# Patient Record
Sex: Male | Born: 1960 | Race: White | Hispanic: No | Marital: Married | Smoking: Current every day smoker
Health system: Southern US, Community
[De-identification: ages and names within clinical notes are randomized; demographics above are authoritative.]

## PROBLEM LIST (undated history)

## (undated) DIAGNOSIS — J387 Other diseases of larynx: Secondary | ICD-10-CM

## (undated) DIAGNOSIS — I8390 Asymptomatic varicose veins of unspecified lower extremity: Secondary | ICD-10-CM

## (undated) DIAGNOSIS — E079 Disorder of thyroid, unspecified: Secondary | ICD-10-CM

## (undated) DIAGNOSIS — E039 Hypothyroidism, unspecified: Secondary | ICD-10-CM

## (undated) HISTORY — DX: Disorder of thyroid, unspecified: E07.9

## (undated) HISTORY — PX: KNEE SURGERY: SHX244

## (undated) HISTORY — DX: Other diseases of larynx: J38.7

## (undated) HISTORY — PX: COLONOSCOPY: SHX174

## (undated) HISTORY — DX: Asymptomatic varicose veins of unspecified lower extremity: I83.90

## (undated) HISTORY — DX: Hypothyroidism, unspecified: E03.9

## (undated) HISTORY — PX: OTHER SURGICAL HISTORY: SHX169

---

## 2015-12-21 ENCOUNTER — Other Ambulatory Visit (HOSPITAL_COMMUNITY): Payer: Self-pay | Admitting: Family Medicine

## 2015-12-21 DIAGNOSIS — I80201 Phlebitis and thrombophlebitis of unspecified deep vessels of right lower extremity: Secondary | ICD-10-CM

## 2015-12-24 ENCOUNTER — Ambulatory Visit (HOSPITAL_COMMUNITY)
Admission: RE | Admit: 2015-12-24 | Discharge: 2015-12-24 | Disposition: A | Discharge status: Home / Self Care | Payer: 59 | Source: Ambulatory Visit | Attending: Family Medicine | Admitting: Family Medicine

## 2015-12-24 DIAGNOSIS — I80201 Phlebitis and thrombophlebitis of unspecified deep vessels of right lower extremity: Secondary | ICD-10-CM

## 2015-12-24 DIAGNOSIS — I82811 Embolism and thrombosis of superficial veins of right lower extremities: Secondary | ICD-10-CM | POA: Diagnosis not present

## 2016-01-14 ENCOUNTER — Encounter: Payer: Self-pay | Admitting: *Deleted

## 2016-01-17 ENCOUNTER — Encounter: Payer: Self-pay | Admitting: Cardiovascular Disease

## 2016-01-17 ENCOUNTER — Ambulatory Visit (INDEPENDENT_AMBULATORY_CARE_PROVIDER_SITE_OTHER): Payer: 59 | Admitting: Cardiovascular Disease

## 2016-01-17 VITALS — BP 140/100 | HR 63 | Ht 70.0 in | Wt 196.0 lb

## 2016-01-17 DIAGNOSIS — I868 Varicose veins of other specified sites: Secondary | ICD-10-CM

## 2016-01-17 DIAGNOSIS — I8390 Asymptomatic varicose veins of unspecified lower extremity: Secondary | ICD-10-CM

## 2016-01-17 DIAGNOSIS — Z136 Encounter for screening for cardiovascular disorders: Secondary | ICD-10-CM

## 2016-01-17 DIAGNOSIS — R030 Elevated blood-pressure reading, without diagnosis of hypertension: Secondary | ICD-10-CM | POA: Diagnosis not present

## 2016-01-17 DIAGNOSIS — IMO0001 Reserved for inherently not codable concepts without codable children: Secondary | ICD-10-CM

## 2016-01-17 NOTE — Progress Notes (Signed)
Patient ID: Aaron Nolan, male   DOB: 25-Jan-1961, 55 y.o.   MRN: [id number]       CARDIOLOGY CONSULT NOTE  Patient ID: Aaron Nolan MRN: [id number] DOB/AGE: 06/28/1961 55 y.o.  Admit date: (Not on file) Primary Physician Jana Half  Reason for Consultation: varicose veins  HPI: The patient is a 55 year old male with a history of tobacco abuse who is referred for the evaluation of venous varicosities. He has a long-standing history of bilateral leg pain. In January he underwent lower extremity ultrasonography which demonstrated superficial venous thrombosis of the right greater saphenous vein.  ECG performed in the office today demonstrates normal sinus rhythm with no ischemic ST segment or T-wave abnormalities, nor any arrhythmias.  He tells me he has had problems with venous varicosities since he was a teenager but they have progressively gotten worse over the years. 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 He denies a history of pulmonary embolism, myocardial infarction, and stroke. He uses compression stockings on occasion which temporarily alleviates the swelling. He has taken aspirin and ibuprofen for pain relief. He has severe pain of both legs.  Says BP is always normal but he is in severe pain today.  Quit smoking 3 weeks ago.  Able to play basketball for 3 hours at a time with teenage sons without limitations other than leg pain (no chest pain, shortness of breath, premature fatigue).   No Known Allergies  Current Outpatient Prescriptions  Medication Sig Dispense Refill  . nicotine (NICODERM CQ - DOSED IN MG/24 HOURS) 21 mg/24hr patch Place 21 mg onto the skin daily.     No current facility-administered medications for this visit.    No past medical history on file.  Past Surgical History  Procedure Laterality Date  . Knee surgery    . Broken ribs    . Collar bone broken       Social History   Social History  . Marital Status: Married    Spouse Name: N/A  . Number of Children: N/A  . Years of Education: N/A   Occupational History  . Not on file.   Social History Main Topics  . Smoking status: Former Smoker    Types: Cigarettes    Quit date: 12/29/2015  . Smokeless tobacco: Never Used  . Alcohol Use: 0.0 oz/week    0 Standard drinks or equivalent per week  . Drug Use: Not on file  . Sexual Activity: Not on file   Other Topics Concern  . Not on file   Social History Narrative     No family history of premature CAD in 1st degree relatives.  Prior to Admission medications   Medication Sig Start Date End Date Taking? Authorizing Provider  nicotine (NICODERM CQ - DOSED IN MG/24 HOURS) 21 mg/24hr patch Place 21 mg onto the skin daily.    Historical Provider, MD     Review of systems complete and found to be negative unless listed above in HPI     Physical exam Blood pressure 140/100, pulse 63, height 5\' 10"  (1.778 m), weight 196 lb (88.905 kg), SpO2 96 %. General: NAD Neck: No JVD, no thyromegaly or thyroid nodule.  Lungs: Clear to auscultation bilaterally with normal respiratory effort. CV: Nondisplaced PMI. Regular rate and rhythm, normal S1/S2, no S3/S4, no murmur. No carotid bruit.  Severe bilateral venous varicosities. Medial right thigh hematoma palpated (tender). Abdomen: Soft, nontender, no distention.  Neurologic: Alert and oriented x 3.  Psych: Normal affect. HEENT: Normal.   ECG: Most recent ECG reviewed.  Labs:  No results found for: WBC, HGB, HCT, MCV, PLT No results for input(s): NA, K, CL, CO2, BUN, CREATININE, CALCIUM, PROT, BILITOT, ALKPHOS, ALT, AST, GLUCOSE in the last 168 hours.  Invalid input(s): LABALBU No results found for: CKTOTAL, CKMB, CKMBINDEX, TROPONINI No results found for: CHOL No results found for: HDL No results found for: LDLCALC No results found for: TRIG No results found for: CHOLHDL No  results found for: LDLDIRECT       Studies: No results found.  ASSESSMENT AND PLAN:  1. 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 No indication for anticoagulation at present but may not be an unreasonable idea given the severity of his problems for DVT/PE prevention.  2. Elevated BP: Says BP is always normal but he is in severe pain today.  Dispo: f/u prn.   Signed: Kate Sable, M.D., F.A.C.C.  01/17/2016, 9:13 AM

## 2016-01-17 NOTE — Patient Instructions (Signed)
   Referral to [facility]VS in [place]. Continue all current medications. Follow up as needed.

## 2016-01-20 ENCOUNTER — Ambulatory Visit (INDEPENDENT_AMBULATORY_CARE_PROVIDER_SITE_OTHER): Payer: 59 | Admitting: Vascular Surgery

## 2016-01-20 ENCOUNTER — Encounter: Payer: Self-pay | Admitting: Vascular Surgery

## 2016-01-20 VITALS — BP 109/72 | HR 71 | Temp 97.8°F | Resp 16 | Ht 70.5 in | Wt 196.0 lb

## 2016-01-20 DIAGNOSIS — I83893 Varicose veins of bilateral lower extremities with other complications: Secondary | ICD-10-CM | POA: Diagnosis not present

## 2016-01-20 NOTE — Progress Notes (Signed)
[facility]  Patient name: Aaron Nolan MRN: [id number] DOB: 1960/12/23 Sex: male  REASON FOR CONSULT:  Severe bilateral venous varicosities  HPI: Aaron Nolan is a 55 y.o. male, who is  Today for discussion of bilateral venous varicosities. He has extensive huge varicosities throughout his thighs and calves bilaterally. These have been progressive for many years. He has a constant pain associated with this both over the varicosities and also with aching sensation with prolonged standing. He recently had an episode of extensive superficial thrombophlebitis in the saphenous vein in his right medial thigh. He was seen at an outlying emergency room and underwent right leg venous duplex to rule out DVT which was negative. Did show clot in his great saphenous vein in this area and is thigh date of this procedure was 12/24/2015. He does have extensive hemosiderin deposit in the left calf related to very large varicosities. No frank venous ulceration yet. No prior history of DVT. Strong family history varicosities  Past Medical History  Diagnosis Date  . Varicose veins     Family History  Problem Relation Age of Onset  . Parkinson's disease Father     SOCIAL HISTORY: Social History   Social History  . Marital Status: Married    Spouse Name: N/A  . Number of Children: N/A  . Years of Education: N/A   Occupational History  . Not on file.   Social History Main Topics  . Smoking status: Former Smoker    Types: Cigarettes    Quit date: 12/29/2015  . Smokeless tobacco: Never Used  . Alcohol Use: 0.0 oz/week    0 Standard drinks or equivalent per week  . Drug Use: No  . Sexual Activity: Not on file   Other Topics Concern  . Not on file   Social History Narrative    No Known Allergies  Current Outpatient Prescriptions  Medication Sig Dispense Refill  . aspirin 81 MG tablet Take 81 mg by mouth daily.    . nicotine (NICODERM CQ - DOSED IN MG/24  HOURS) 21 mg/24hr patch Place 21 mg onto the skin daily.     No current facility-administered medications for this visit.    REVIEW OF SYSTEMS:  [X]  denotes positive finding, [ ]  denotes negative finding Cardiac  Comments:  Chest pain or chest pressure:    Shortness of breath upon exertion:    Short of breath when lying flat:    Irregular heart rhythm:        Vascular    Pain in calf, thigh, or hip brought on by ambulation:    Pain in feet at night that wakes you up from your sleep:     Blood clot in your veins:    Leg swelling:         Pulmonary    Oxygen at home:    Productive cough:     Wheezing:         Neurologic    Sudden weakness in arms or legs:     Sudden numbness in arms or legs:     Sudden onset of difficulty speaking or slurred speech:    Temporary loss of vision in one eye:     Problems with dizziness:         Gastrointestinal    Blood in stool:     Vomited blood:         Genitourinary    Burning when urinating:     Blood in urine:  Psychiatric    Major depression:         Hematologic    Bleeding problems:    Problems with blood clotting too easily:        Skin    Rashes or ulcers:        Constitutional    Fever or chills:      PHYSICAL EXAM: Filed Vitals:   01/20/16 0938  BP: 109/72  Pulse: 71  Temp: 97.8 F (36.6 C)  Resp: 16  Height: 5' 10.5" (1.791 m)  Weight: 196 lb (88.905 kg)  SpO2: 97%    GENERAL: The patient is a well-nourished male, in no acute distress. The vital signs are documented above.Marland Kitchen  VASCULAR:  2+ dorsalis pedis pulses bilaterally PULMONARY: There is good air exchange MUSCULOSKELETAL: There are no major deformities or cyanosis. NEUROLOGIC: No focal weakness or paresthesias are detected. SKIN:  Hemosiderin deposit in his medial proximal left calf. PSYCHIATRIC: The patient has a normal affect.  extensive markedly enlarged varicosities bilaterally extending from his thighs and calves onto his feet  DATA:    he did not undergo formal venous reflux study in our office today. I did image his veins with SonoSite ultrasound. This shows very large saphenous vein bilaterally through the thighs. It is thrombosed in the segment in his medial right thigh.   MEDICAL ISSUES:  severe venous reflux bilaterally. He has extreme  For official reflux in these varicosities. Will undergo formal venous duplex on the visit in 3 months. He was fitted today with graduated compression garments. He reports that he had these for years ago but has not been wearing them a great deal recently. I did explain the critical importance of graduated compression garments elevation and ibuprofen for discomfort. Explained that we would allow resolution of his superficial thrombophlebitis in his right thigh before proceeding with any treatment is indicated. We will see him again for 3 and 3 months for continued discussion   Aaron Nolan [facility] [employer] [facility]: [phone]

## 2016-01-20 NOTE — Addendum Note (Signed)
Addended by: Dorthula Rue L on: 01/20/2016 04:45 PM   Modules accepted: Orders

## 2016-01-24 ENCOUNTER — Other Ambulatory Visit: Payer: Self-pay | Admitting: *Deleted

## 2016-01-24 DIAGNOSIS — I83893 Varicose veins of bilateral lower extremities with other complications: Secondary | ICD-10-CM

## 2016-02-07 ENCOUNTER — Encounter (INDEPENDENT_AMBULATORY_CARE_PROVIDER_SITE_OTHER): Payer: 59

## 2016-02-07 DIAGNOSIS — I83893 Varicose veins of bilateral lower extremities with other complications: Secondary | ICD-10-CM

## 2016-03-07 ENCOUNTER — Encounter: Payer: 59 | Admitting: Vascular Surgery

## 2016-03-07 ENCOUNTER — Encounter (HOSPITAL_COMMUNITY): Payer: 59

## 2016-04-18 ENCOUNTER — Encounter: Payer: Self-pay | Admitting: Vascular Surgery

## 2016-04-25 ENCOUNTER — Ambulatory Visit (HOSPITAL_COMMUNITY)
Admission: RE | Admit: 2016-04-25 | Discharge: 2016-04-25 | Disposition: A | Discharge status: Home / Self Care | Payer: 59 | Source: Ambulatory Visit | Attending: Vascular Surgery | Admitting: Vascular Surgery

## 2016-04-25 ENCOUNTER — Encounter: Payer: Self-pay | Admitting: Vascular Surgery

## 2016-04-25 ENCOUNTER — Ambulatory Visit (INDEPENDENT_AMBULATORY_CARE_PROVIDER_SITE_OTHER): Payer: 59 | Admitting: Vascular Surgery

## 2016-04-25 VITALS — BP 107/74 | HR 72 | Temp 97.1°F | Resp 18 | Ht 70.0 in | Wt 196.6 lb

## 2016-04-25 DIAGNOSIS — I83893 Varicose veins of bilateral lower extremities with other complications: Secondary | ICD-10-CM | POA: Diagnosis present

## 2016-04-25 NOTE — Progress Notes (Signed)
Problems with Activities of Daily Living Secondary to Leg Pain  1. Mr. [name] has great difficulty with prolonged sitting due to leg pain. (especially  travel in cars)  2. Mr. [name] states he can not climb ladders due to leg pain.  (required by his job)   3. Mr. [name] states he has great difficulty squatting due to leg pain.  ( required by his job)     Failure of  Conservative Therapy:  1. Worn 20-30 mm Hg thigh high compression hose >3 months with no relief of symptoms.  2. Frequently elevates legs-no relief of symptoms  3. Taken Ibuprofen 600 Mg TID with no relief of symptoms.    [facility] of [place]  Patient name: Aaron Nolan MRN: [id number] DOB: 1961/02/13 Sex: male  REASON FOR VISIT: Follow-up of severe bilateral lower extremity venous hypertension  HPI: Aaron Nolan is a 55 y.o. male seen in 3 months ago with extensive superficial thrombophlebitis in his right great saphenous vein. He has had bilateral severe symptoms. He has huge varicosities throughout his left thigh and left calf. Marked varicosities on the right but not to the same extent as on the left leg. He has been compliant with his compression garments his had no relief related to this. He is very active on his feet on a daily basis.  Past Medical History  Diagnosis Date  . Varicose veins   . Thyroid disease     hypothyroidism    Family History  Problem Relation Age of Onset  . Parkinson's disease Father     SOCIAL HISTORY: Social History  Substance Use Topics  . Smoking status: Current Every Day Smoker -- 0.25 packs/day    Types: Cigarettes  . Smokeless tobacco: Never Used  . Alcohol Use: 0.0 oz/week    0 Standard drinks or equivalent per week    No Known Allergies  Current Outpatient Prescriptions  Medication Sig Dispense Refill  . aspirin 81 MG tablet Take 81 mg by mouth daily.    Marland Kitchen levothyroxine (SYNTHROID, LEVOTHROID) 25 MCG tablet 25 mcg daily.  1  . nicotine  (NICODERM CQ - DOSED IN MG/24 HOURS) 21 mg/24hr patch Place 21 mg onto the skin daily. Reported on 04/25/2016     No current facility-administered medications for this visit.    REVIEW OF SYSTEMS:  [X]  denotes positive finding, [ ]  denotes negative finding Cardiac  Comments:  Chest pain or chest pressure:    Shortness of breath upon exertion:    Short of breath when lying flat:    Irregular heart rhythm:        Vascular    Pain in calf, thigh, or hip brought on by ambulation: x vv  Pain in feet at night that wakes you up from your sleep:  x   Blood clot in your veins: x   Leg swelling:  x       Pulmonary    Oxygen at home:    Productive cough:     Wheezing:         Neurologic    Sudden weakness in arms or legs:     Sudden numbness in arms or legs:     Sudden onset of difficulty speaking or slurred speech:    Temporary loss of vision in one eye:     Problems with dizziness:         Gastrointestinal    Blood in stool:     Vomited blood:  Genitourinary    Burning when urinating:     Blood in urine:        Psychiatric    Major depression:         Hematologic    Bleeding problems:    Problems with blood clotting too easily:        Skin    Rashes or ulcers:        Constitutional    Fever or chills:      PHYSICAL EXAM: Filed Vitals:   04/25/16 1157  BP: 107/74  Pulse: 72  Temp: 97.1 F (36.2 C)  TempSrc: Oral  Resp: 18  Height: 5\' 10"  (1.778 m)  Weight: 196 lb 9.6 oz (89.177 kg)  SpO2: 95%    GENERAL: The patient is a well-nourished male, in no acute distress. The vital signs are documented above. CARDIAC: There is a regular rate and rhythm.  VASCULAR: Plus dorsalis pedis pulses bilaterally PULMONARY: There is good air exchange   MUSCULOSKELETAL: There are no major deformities or cyanosis. NEUROLOGIC: No focal weakness or paresthesias are detected. SKIN: Extensive hemosiderin deposits medial left calf over very large varicositie PSYCHIATRIC:  The patient has a normal affect.  DATA:  Formal duplex today revealed reflux throughout his great saphenous vein bilaterally with marked enlargement. His does extend into the varicosities bilaterally  MEDICAL ISSUES: Failed conservative treatment of symptomatic varicosities. Have recommended staged bilateral laser ablation and stab phlebectomy for treatment of asymptomatic hypertension. Discussed the procedure with the patient and his wife present. Explained the procedure of laser ablation and also stab phlebectomy of the very large varicosities. Very slight risk of DVT associated with procedure. He wished proceed as soon as possible with the left leg for some cysts most symptomatic.    Curt Jews [facility] [phone]

## 2016-05-04 ENCOUNTER — Other Ambulatory Visit: Payer: Self-pay | Admitting: *Deleted

## 2016-05-04 DIAGNOSIS — I83893 Varicose veins of bilateral lower extremities with other complications: Secondary | ICD-10-CM

## 2016-05-25 ENCOUNTER — Encounter: Payer: Self-pay | Admitting: Vascular Surgery

## 2016-05-29 ENCOUNTER — Encounter: Payer: Self-pay | Admitting: Vascular Surgery

## 2016-06-01 ENCOUNTER — Ambulatory Visit (INDEPENDENT_AMBULATORY_CARE_PROVIDER_SITE_OTHER): Payer: 59 | Admitting: Vascular Surgery

## 2016-06-01 ENCOUNTER — Encounter: Payer: Self-pay | Admitting: Vascular Surgery

## 2016-06-01 VITALS — BP 141/88 | HR 60 | Temp 98.1°F | Resp 16 | Ht 70.5 in | Wt 193.0 lb

## 2016-06-01 DIAGNOSIS — I83893 Varicose veins of bilateral lower extremities with other complications: Secondary | ICD-10-CM | POA: Insufficient documentation

## 2016-06-01 HISTORY — PX: ENDOVENOUS ABLATION SAPHENOUS VEIN W/ LASER: SUR449

## 2016-06-01 NOTE — Progress Notes (Signed)
     Laser Ablation Procedure    Date: 06/01/2016   Izaac Donnally DOB:07-26-1961  Consent signed: Yes    Surgeon:  Dr. Sherren Mocha Early  Procedure: Laser Ablation: left Greater Saphenous Vein  BP 141/88 mmHg  Pulse 60  Temp(Src) 98.1 F (36.7 C) (Oral)  Resp 16  Ht 5' 10.5" (1.791 m)  Wt 193 lb (87.544 kg)  BMI 27.29 kg/m2  SpO2 97%  Tumescent Anesthesia: 1000 cc 0.9% NaCl with 50 cc Lidocaine HCL with 1% Epi and 15 cc 8.4% NaHCO3  Local Anesthesia: 6 cc Lidocaine HCL and NaHCO3 (ratio 2:1)  15 watts continuous mode        Total energy: 2050 Joules   Total time: 2:16    Stab Phlebectomy: >20 incisions Sites: Thigh, Calf and Ankle left leg  Patient tolerated procedure well    Description of Procedure:  After marking the course of the secondary varicosities, the patient was placed on the operating table in the supine position, and the left leg was prepped and draped in sterile fashion.   Local anesthetic was administered and under ultrasound guidance the saphenous vein was accessed with a micro needle and guide wire; then the mirco puncture sheath was placed.  A guide wire was inserted saphenofemoral junction , followed by a 5 french sheath.  The position of the sheath and then the laser fiber below the junction was confirmed using the ultrasound.  Tumescent anesthesia was administered along the course of the saphenous vein using ultrasound guidance. The patient was placed in Trendelenburg position and protective laser glasses were placed on patient and staff, and the laser was fired at 15 watts continuous mode advancing 1-21mm/second for a total of 2050 joules.   For stab phlebectomies, local anesthetic was administered at the previously marked varicosities, and tumescent anesthesia was administered around the vessels.  Greater than 20 stab wounds were made using the tip of an 11 blade. And using the vein hook, the phlebectomies were performed using a hemostat to avulse the  varicosities.  Adequate hemostasis was achieved.     Steri strips were applied  and ABD pads and thigh high compression stockings were applied.  Ace wrap bandages were applied  at the top of the saphenofemoral junction. Blood loss was less than 15 cc.  The patient ambulated out of the operating room having tolerated the procedure well.  Uneventful ablation from just below the knee to the saphenofemoral junction. Stab phlebectomy of multiple large varicosities in the medial thigh and knee region posterior medial and lateral calf area

## 2016-06-01 NOTE — Progress Notes (Signed)
Filed Vitals:   06/01/16 0821 06/01/16 0842  BP: 144/94 141/88  Pulse: 60   Temp: 98.1 F (36.7 C)   TempSrc: Oral   Resp: 16   Height: 5' 10.5" (1.791 m)   Weight: 193 lb (87.544 kg)   SpO2: 97%

## 2016-06-02 ENCOUNTER — Telehealth: Payer: Self-pay | Admitting: *Deleted

## 2016-06-02 NOTE — Telephone Encounter (Signed)
    06/02/2016  Time: 9:13 AM   Patient Name: Aaron Nolan  Patient of: T.F. Early  Procedure:Laser Ablation and stab phlebectomy > 20 incisions left leg  left 06-01-2016  Reached patient at home and checked  His status  Yes    Comments/Actions Taken: Aaron Nolan states "my left leg feels so much better than it did before the procedure."  Aaron Nolan states no bleeding/oozing from left leg incision sites and no left leg pain.  Reviewed post procedural instructions with Aaron Nolan and reminded him of post LA duplex and VV FU with Dr. Donnetta Hutching on 06-08-2016.      @SIGNATURE @

## 2016-06-05 ENCOUNTER — Encounter: Payer: Self-pay | Admitting: Vascular Surgery

## 2016-06-08 ENCOUNTER — Encounter (HOSPITAL_COMMUNITY): Payer: 59

## 2016-06-08 ENCOUNTER — Ambulatory Visit: Payer: 59 | Admitting: Vascular Surgery

## 2016-06-08 ENCOUNTER — Ambulatory Visit (HOSPITAL_COMMUNITY)
Admission: RE | Admit: 2016-06-08 | Discharge: 2016-06-08 | Disposition: A | Discharge status: Home / Self Care | Payer: 59 | Source: Ambulatory Visit | Attending: Vascular Surgery | Admitting: Vascular Surgery

## 2016-06-08 DIAGNOSIS — I83893 Varicose veins of bilateral lower extremities with other complications: Secondary | ICD-10-CM | POA: Insufficient documentation

## 2016-06-08 DIAGNOSIS — Z9889 Other specified postprocedural states: Secondary | ICD-10-CM | POA: Insufficient documentation

## 2016-06-09 ENCOUNTER — Encounter: Payer: Self-pay | Admitting: Vascular Surgery

## 2016-06-15 ENCOUNTER — Encounter: Payer: Self-pay | Admitting: Vascular Surgery

## 2016-06-15 ENCOUNTER — Ambulatory Visit (INDEPENDENT_AMBULATORY_CARE_PROVIDER_SITE_OTHER): Payer: Self-pay | Admitting: Vascular Surgery

## 2016-06-15 VITALS — BP 126/85 | HR 70 | Temp 97.1°F | Resp 16 | Ht 70.5 in | Wt 192.0 lb

## 2016-06-15 DIAGNOSIS — I83893 Varicose veins of bilateral lower extremities with other complications: Secondary | ICD-10-CM

## 2016-06-15 NOTE — Progress Notes (Signed)
   Patient name: Aaron Nolan MRN: [id number] DOB: 15-Oct-1961 Sex: male  REASON FOR VISIT: Follow-up laser ablation left great saphenous vein and stab phlebectomy of multiple left leg varicosities  HPI: Aaron Nolan is a 55 y.o. male here today for 2 week follow-up. He has done extremely well. He does have the usual amount of soreness in the medial thigh from the ablation. This adnexa result regarding the phlebectomy of his very large varicosities throughout his leg.  Current Outpatient Prescriptions  Medication Sig Dispense Refill  . aspirin 81 MG tablet Take 81 mg by mouth daily.    Marland Kitchen levothyroxine (SYNTHROID, LEVOTHROID) 25 MCG tablet 25 mcg daily.  1  . nicotine (NICODERM CQ - DOSED IN MG/24 HOURS) 21 mg/24hr patch Place 21 mg onto the skin daily. Reported on 06/15/2016     No current facility-administered medications for this visit.      PHYSICAL EXAM: Filed Vitals:   06/15/16 0958  BP: 126/85  Pulse: 70  Temp: 97.1 F (36.2 C)  Resp: 16  Height: 5' 10.5" (1.791 m)  Weight: 192 lb (87.091 kg)  SpO2: 96%    GENERAL: The patient is a well-nourished male, in no acute distress. The vital signs are documented above. Does have thickness throughout his saphenous vein which is palpable under his skin in his calf and thigh. Minimal bruising. A good result. He did have huge varicosities throughout his thigh and calf. He does have marked resolution of these. There are some obviously that are persistent and he understands this.   MEDICAL ISSUES: Duplex from 06/08/2016 was reviewed. This showed no evidence of DVT ablation of the saphenous vein throughout its course. Impression and plan excellent result regarding his left leg. He is very pleased with his result and very complimentary of our practice. He wished to proceed with similar treatment to his right leg on 07/20/2016   Rosetta Posner, MD [facility] Tel  [phone] Pager [phone]

## 2016-06-16 ENCOUNTER — Other Ambulatory Visit: Payer: Self-pay | Admitting: *Deleted

## 2016-06-16 DIAGNOSIS — I83893 Varicose veins of bilateral lower extremities with other complications: Secondary | ICD-10-CM

## 2016-07-18 ENCOUNTER — Encounter: Payer: Self-pay | Admitting: Vascular Surgery

## 2016-07-20 ENCOUNTER — Ambulatory Visit (INDEPENDENT_AMBULATORY_CARE_PROVIDER_SITE_OTHER): Payer: 59 | Admitting: Vascular Surgery

## 2016-07-20 ENCOUNTER — Encounter: Payer: Self-pay | Admitting: Vascular Surgery

## 2016-07-20 VITALS — BP 128/89 | HR 61 | Temp 97.2°F | Resp 18 | Ht 70.5 in | Wt 193.9 lb

## 2016-07-20 DIAGNOSIS — I83893 Varicose veins of bilateral lower extremities with other complications: Secondary | ICD-10-CM

## 2016-07-20 HISTORY — PX: ENDOVENOUS ABLATION SAPHENOUS VEIN W/ LASER: SUR449

## 2016-07-20 NOTE — Progress Notes (Signed)
     Laser Ablation Procedure    Date: 07/20/2016   Arvie Alsept DOB:[birth year]  Consent signed: Yes    Surgeon:  Dr. Sherren Mocha Early  Procedure: Laser Ablation: right Greater Saphenous Vein  BP 128/89 (BP Location: Left Arm, Patient Position: Sitting, Cuff Size: Normal)   Pulse 61   Temp 97.2 F (36.2 C) (Oral)   Resp 18   Ht 5' 10.5" (1.791 m)   Wt 193 lb 14.4 oz (88 kg)   SpO2 98%   BMI 27.43 kg/m   Tumescent Anesthesia: 900 cc 0.9% NaCl with 50 cc Lidocaine HCL with 1% Epi and 15 cc 8.4% NaHCO3  Local Anesthesia: 4 cc Lidocaine HCL and NaHCO3 (ratio 2:1)  15 watts continuous mode        Total energy: 2272 Joules   Total time: 2:31    Stab Phlebectomy: 10-20 Sites: Thigh, Calf and Ankle  Patient tolerated procedure well    Description of Procedure:  After marking the course of the secondary varicosities, the patient was placed on the operating table in the supine position, and the right leg was prepped and draped in sterile fashion.   Local anesthetic was administered and under ultrasound guidance the saphenous vein was accessed with a micro needle and guide wire; then the mirco puncture sheath was placed.  A guide wire was inserted saphenofemoral junction , followed by a 5 french sheath.  The position of the sheath and then the laser fiber below the junction was confirmed using the ultrasound.  Tumescent anesthesia was administered along the course of the saphenous vein using ultrasound guidance. The patient was placed in Trendelenburg position and protective laser glasses were placed on patient and staff, and the laser was fired at 15 watts continuous mode advancing 1-39mm/second for a total of 2272 joules.   For stab phlebectomies, local anesthetic was administered at the previously marked varicosities, and tumescent anesthesia was administered around the vessels.  Ten to 20 stab wounds were made using the tip of an 11 blade. And using the vein hook, the phlebectomies were  performed using a hemostat to avulse the varicosities.  Adequate hemostasis was achieved.     Steri strips were applied to the stab wounds and ABD pads and thigh high compression stockings were applied.  Ace wrap bandages were applied over the phlebectomy sites and at the top of the saphenofemoral junction. Blood loss was less than 15 cc.  The patient ambulated out of the operating room having tolerated the procedure well.

## 2016-07-20 NOTE — Progress Notes (Signed)
Vitals:   07/20/16 0826 07/20/16 0831  BP: 140/90 128/89  Pulse: 61   Resp: 18   Temp: 97.2 F (36.2 C)   TempSrc: Oral   SpO2: 98%   Weight: 193 lb 14.4 oz (88 kg)   Height: 5' 10.5" (1.791 m)

## 2016-07-20 NOTE — Progress Notes (Signed)
Blood pressure 128/89, pulse 61, temperature 97.2 F (36.2 C), temperature source Oral, resp. rate 18, height 5' 10.5" (1.791 m), weight 193 lb 14.4 oz (88 kg), SpO2 98 %.

## 2016-07-21 ENCOUNTER — Encounter: Payer: Self-pay | Admitting: Vascular Surgery

## 2016-07-21 ENCOUNTER — Telehealth: Payer: Self-pay | Admitting: *Deleted

## 2016-07-21 NOTE — Telephone Encounter (Signed)
    07/21/2016  Time: 10:31 AM   Patient Name: Aaron Nolan  Patient of: T.F. Early  Procedure:Laser Ablation and stab phlebectomy 10-20 incisions (right leg) 07-20-2016 right  Reached patient at home and checked  His status  Yes    Comments/Actions Taken: Mr. Overton states that his right leg (right inner thigh) is "sore".  Mr. Balough reports no bleeding or oozing (right leg).  Mr. Raffetto is taking Ibuprofen as directed and using ice compresses to right leg.  Reviewed post procedural instructions with Mr. Broomfield and reminded him of post LA duplex and VV follow up appointment with Dr. Donnetta Hutching on 07-27-2016.      @SIGNATURE @

## 2016-07-24 ENCOUNTER — Encounter: Payer: Self-pay | Admitting: Vascular Surgery

## 2016-07-27 ENCOUNTER — Ambulatory Visit (HOSPITAL_COMMUNITY)
Admission: RE | Admit: 2016-07-27 | Discharge: 2016-07-27 | Disposition: A | Discharge status: Home / Self Care | Payer: 59 | Source: Ambulatory Visit | Attending: Vascular Surgery | Admitting: Vascular Surgery

## 2016-07-27 ENCOUNTER — Ambulatory Visit (INDEPENDENT_AMBULATORY_CARE_PROVIDER_SITE_OTHER): Payer: Self-pay | Admitting: Vascular Surgery

## 2016-07-27 ENCOUNTER — Encounter: Payer: Self-pay | Admitting: Vascular Surgery

## 2016-07-27 VITALS — BP 121/86 | HR 77 | Temp 98.2°F | Resp 18 | Ht 70.5 in | Wt 198.7 lb

## 2016-07-27 DIAGNOSIS — I83893 Varicose veins of bilateral lower extremities with other complications: Secondary | ICD-10-CM | POA: Diagnosis not present

## 2016-07-27 DIAGNOSIS — Z9889 Other specified postprocedural states: Secondary | ICD-10-CM | POA: Insufficient documentation

## 2016-07-27 NOTE — Progress Notes (Signed)
   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 male here today for follow-up. I did well with his second leg. Did have the typical amount of discomfort over the phlebectomy and ablation sites. Has returned to his usual full activities  Current Outpatient Prescriptions  Medication Sig Dispense Refill  . aspirin 81 MG tablet Take 81 mg by mouth daily.    Marland Kitchen levothyroxine (SYNTHROID, LEVOTHROID) 25 MCG tablet 25 mcg daily.  1  . nicotine (NICODERM CQ - DOSED IN MG/24 HOURS) 21 mg/24hr patch Place 21 mg onto the skin daily. Reported on 06/15/2016     No current facility-administered medications for this visit.      PHYSICAL EXAM: Vitals:   07/27/16 0835  BP: 121/86  Pulse: 77  Resp: 18  Temp: 98.2 F (36.8 C)  TempSrc: Oral  SpO2: 96%  Weight: 198 lb 11.2 oz (90.1 kg)  Height: 5' 10.5" (1.791 m)    GENERAL: The patient is a well-nourished male, in no acute distress. The vital signs are documented above. Does have the typical bruising associated with procedure and some thickening under the phlebectomy sites for his very large saphenous veins throughout his thigh and calf.  Duplex today shows ablation and closure of his great saphenous vein from the insertion site is calf to the level of the common femoral vein with no extension into the common femoral vein no DVT  MEDICAL ISSUES: Stable status post staged bilateral laser ablation of great saphenous vein and stab phlebectomy of very large multiple varicosities bilaterally. Will continue her compression garment on the right leg for one additional week and bilaterally on as-needed basis. He will see [place] again on as-needed basis   Rosetta Posner, MD [facility]  [facility] Tel [phone] Pager [phone]

## 2017-12-15 IMAGING — US US EXTREM LOW VENOUS*R*
1 series · 14 of 24 positions shown · non-contrast
Comparison: None.

CLINICAL DATA: Right thigh lump

EXAM:
RIGHT LOWER EXTREMITY VENOUS DUPLEX ULTRASOUND
TECHNIQUE: Doppler venous assessment of the right lower extremity deep venous
system was performed, including characterization of spectral flow,
compressibility, and phasicity.

[Series 1: us extrem low venous*right* · 0.08mm/px · 14 of 42 slices shown]
[im 1/42]
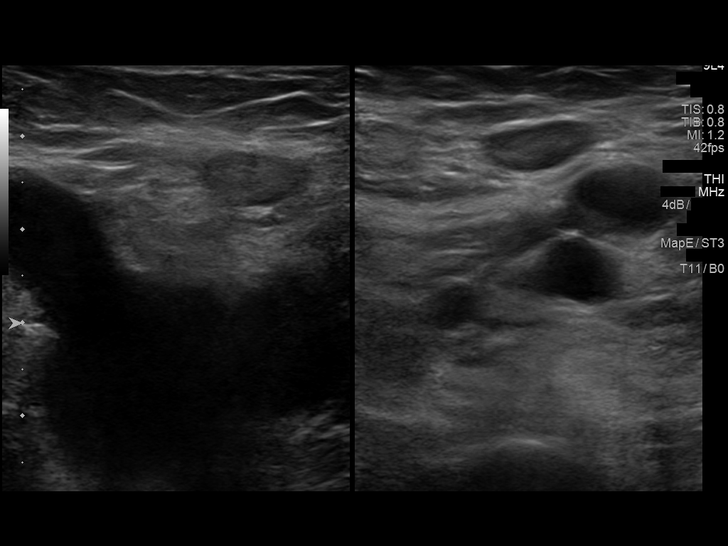
[im 4/42]
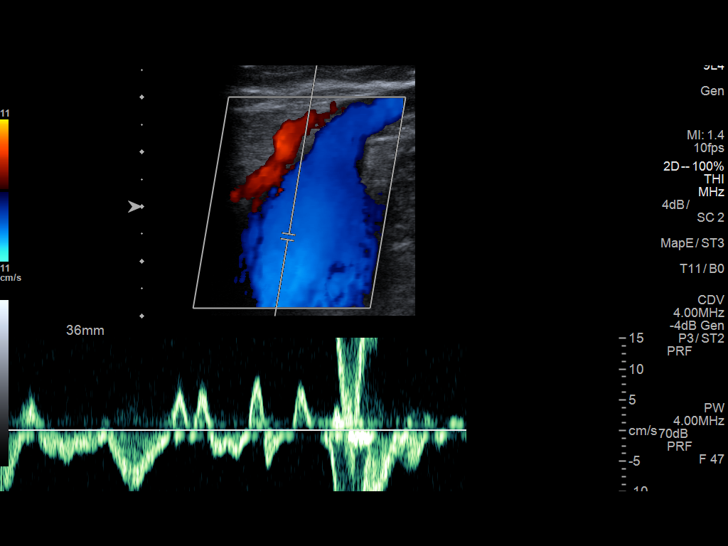
[im 8/42]
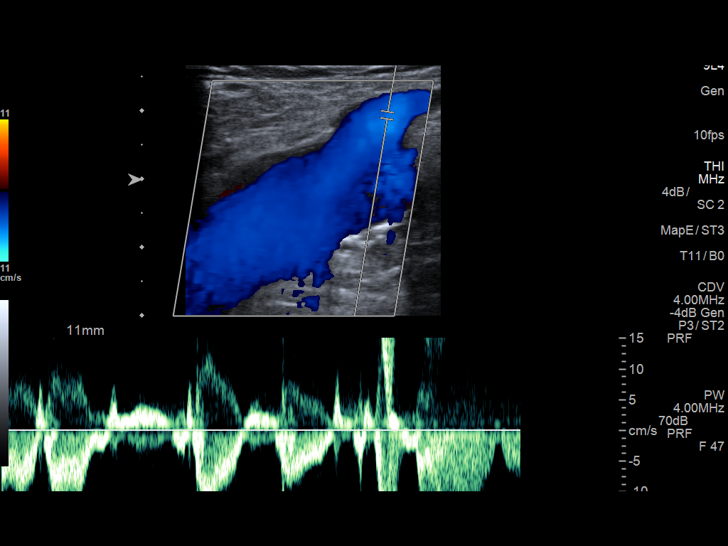
[im 11/42]
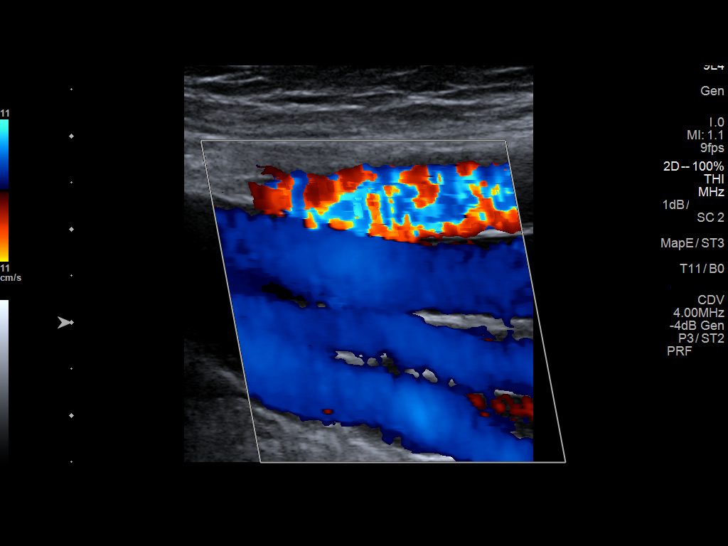
[im 13/42]
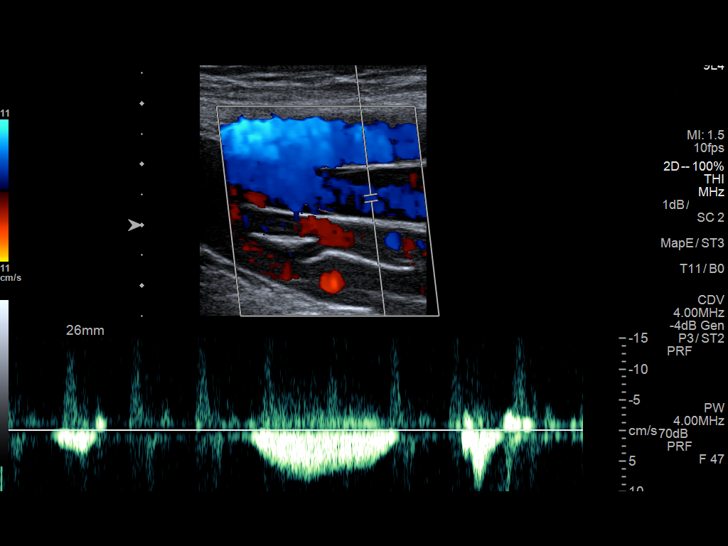
[im 17/42]
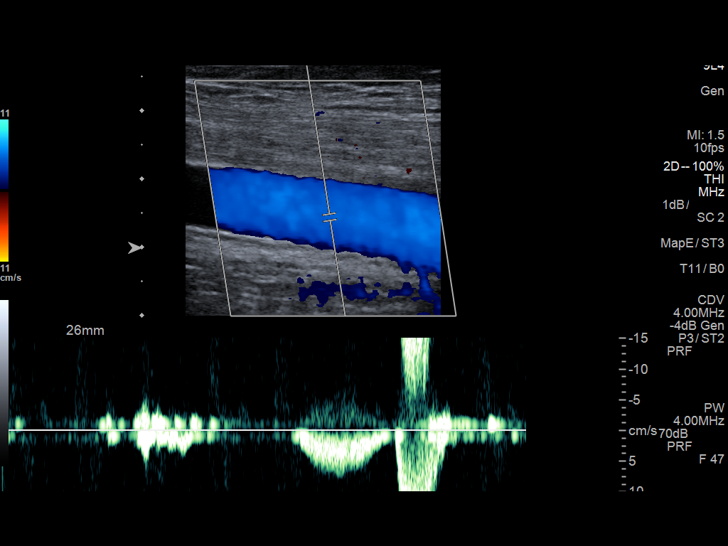
[im 20/42]
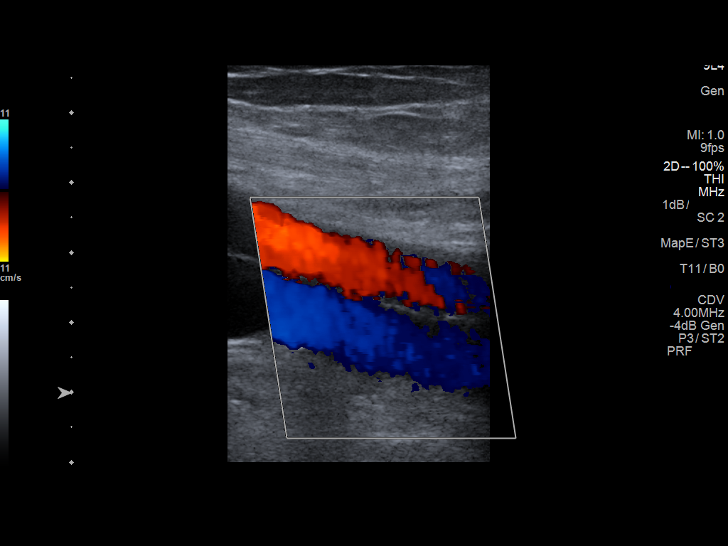
[im 22/42]
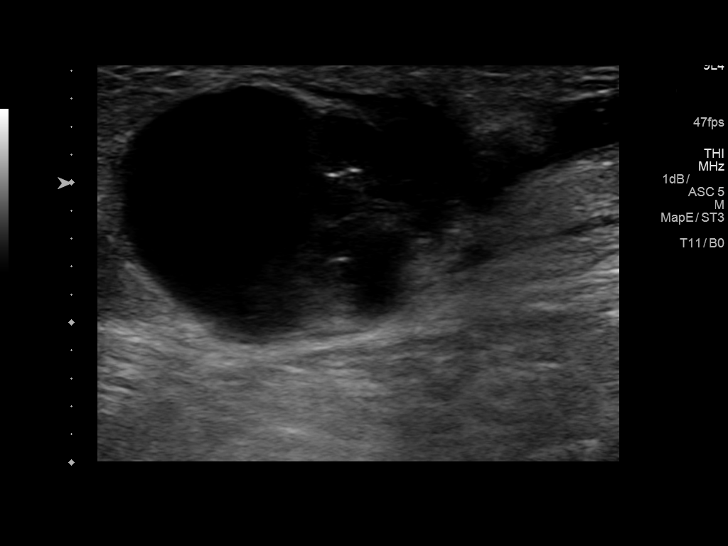
[im 25/42]
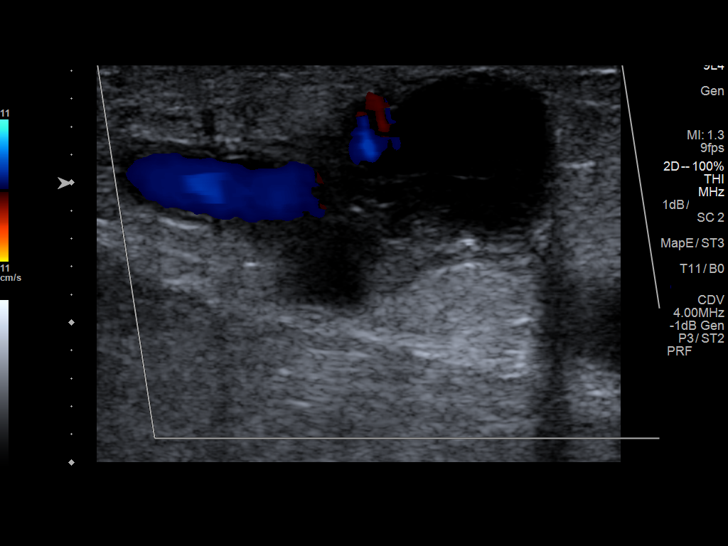
[im 29/42]
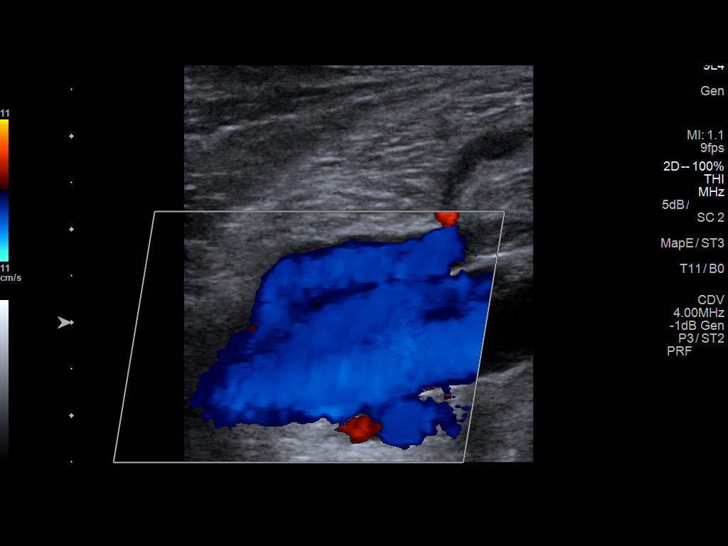
[im 33/42]
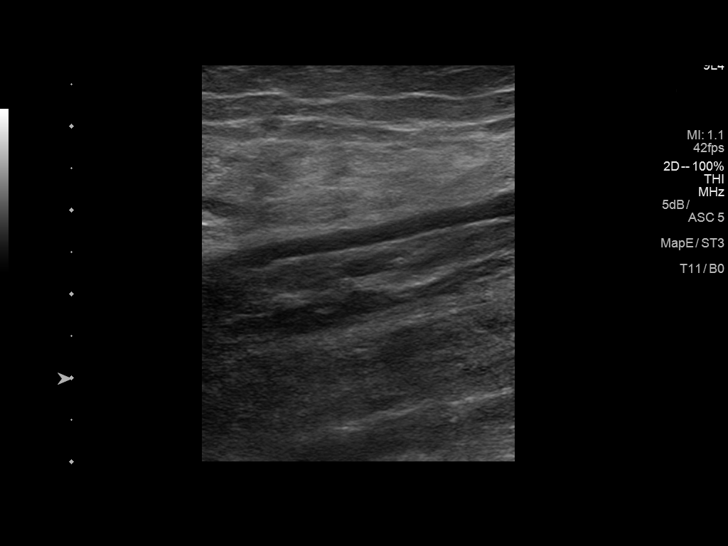
[im 34/42]
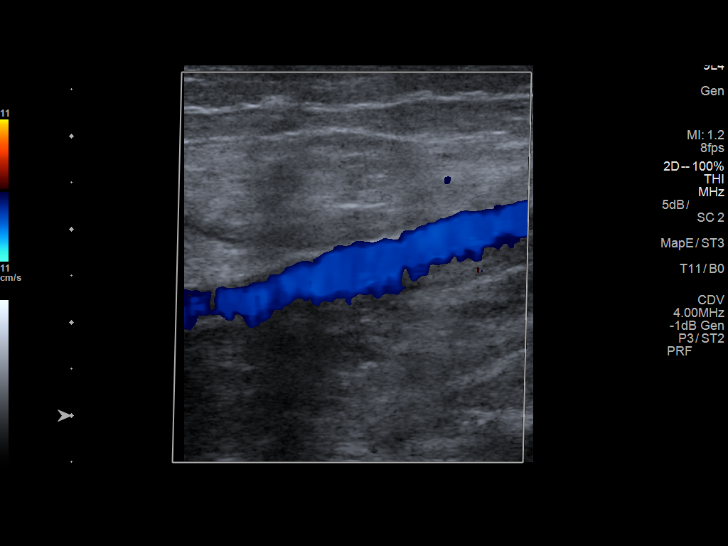
[im 38/42]
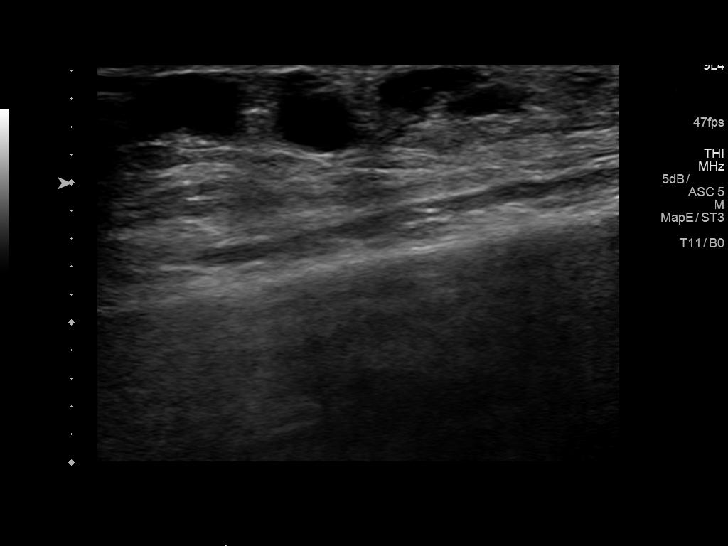
[im 42/42]
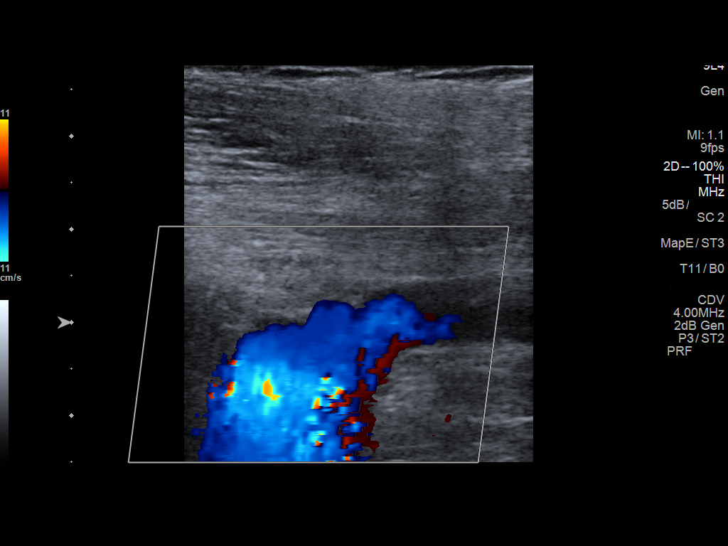

[14 of 24 positions shown; findings below may reference images not displayed]

FINDINGS: There is complete compressibility of the right common femoral,
femoral, and popliteal veins. Doppler analysis demonstrates
respiratory phasicity and augmentation of flow with calf
compression.

There is focal dilatation of the greater saphenous vein at the
palpable abnormality with superficial vein thrombosis. The distal
greater saphenous vein is patent.
IMPRESSION: No evidence of DVT.

There is superficial vein thrombosis in the greater saphenous vein
at the region of the palpable abnormality.

## 2019-03-20 ENCOUNTER — Encounter: Payer: Self-pay | Admitting: Internal Medicine

## 2019-06-11 ENCOUNTER — Other Ambulatory Visit: Payer: Self-pay

## 2019-06-11 ENCOUNTER — Ambulatory Visit: Payer: No Typology Code available for payment source | Admitting: Gastroenterology

## 2019-06-11 ENCOUNTER — Encounter: Payer: Self-pay | Admitting: Gastroenterology

## 2019-06-11 DIAGNOSIS — Z1211 Encounter for screening for malignant neoplasm of colon: Secondary | ICD-10-CM | POA: Diagnosis not present

## 2019-06-11 MED ORDER — SUPREP BOWEL PREP KIT 17.5-3.13-1.6 GM/177ML PO SOLN: 1.0000 | ORAL | 0 refills | Status: DC

## 2019-06-11 NOTE — Progress Notes (Signed)
Primary Care Physician:  Scherrie Bateman Primary Gastroenterologist:  Dr. Gala Romney   Chief Complaint  Patient presents with  . Colonoscopy    last TCS approx 10 yrs ago in [place]    HPI:   Aaron Nolan is a 58 y.o. male presenting today at the request of Delman Cheadle, [place], for screening colonoscopy. Last colonoscopy about 10 years ago in [place]. Doesn't remember if any history of colon polyps. No family history of colorectal cancer or polyps.  No changes in bowel habits. One BM a day. No rectal bleeding. No abdominal pain. Remote history of EGD per patient. Reflux rare. Usually dietary-related. Spicy foods will trigger. No dysphagia.   As of note, referral documentation mentions some concern of possible abdominal mass on palpation.   Past Medical History:  Diagnosis Date  . Hypothyroidism    hypothyroidism  . Varicose veins     Past Surgical History:  Procedure Laterality Date  . broken ribs    . collar bone broken    . ENDOVENOUS ABLATION SAPHENOUS VEIN W/ LASER Left 06-01-2016   endovenous laser ablation left greater saphenous vein and stab phlebectomy >20 incisions left leg by Curt Jews MD  . ENDOVENOUS ABLATION SAPHENOUS VEIN W/ LASER Right 07/20/2016   endovenous laser ablation right greater saphenous vein and stab phlebectomy 10-20 incisions right leg by Curt Jews MD  . KNEE SURGERY      Current Outpatient Medications  Medication Sig Dispense Refill  . Na Sulfate-K Sulfate-Mg Sulf (SUPREP BOWEL PREP KIT) 17.5-3.13-1.6 GM/177ML SOLN Take 1 kit by mouth as directed. 354 mL 0   No current facility-administered medications for this visit.     Allergies as of 06/11/2019 - Review Complete 06/11/2019  Allergen Reaction Noted  . Tetanus toxoids  06/11/2019    Family History  Problem Relation Age of Onset  . Parkinson's disease Father   . Colon cancer Neg Hx   . Colon polyps Neg Hx     Social History   Socioeconomic History  . Marital  status: Married    Spouse name: Not on file  . Number of children: Not on file  . Years of education: Not on file  . Highest education level: Not on file  Occupational History  . Not on file  Social Needs  . Financial resource strain: Not on file  . Food insecurity    Worry: Not on file    Inability: Not on file  . Transportation needs    Medical: Not on file    Non-medical: Not on file  Tobacco Use  . Smoking status: Current Every Day Smoker    Packs/day: 0.50    Types: Cigarettes  . Smokeless tobacco: Never Used  Substance and Sexual Activity  . Alcohol use: Yes    Alcohol/week: 0.0 standard drinks    Comment: 2 12oz beers/day, average 2.5 on weekend.   . Drug use: No  . Sexual activity: Not on file  Lifestyle  . Physical activity    Days per week: Not on file    Minutes per session: Not on file  . Stress: Not on file  Relationships  . Social Herbalist on phone: Not on file    Gets together: Not on file    Attends religious service: Not on file    Active member of club or organization: Not on file    Attends meetings of clubs or organizations: Not on file  Relationship status: Not on file  . Intimate partner violence    Fear of current or ex partner: Not on file    Emotionally abused: Not on file    Physically abused: Not on file    Forced sexual activity: Not on file  Other Topics Concern  . Not on file  Social History Narrative  . Not on file    Review of Systems: Gen: Denies any fever, chills, fatigue, weight loss, lack of appetite.  CV: Denies chest pain, heart palpitations, peripheral edema, syncope.  Resp: Denies shortness of breath at rest or with exertion. Denies wheezing or cough.  GI: see HPI GU : Denies urinary burning, urinary frequency, urinary hesitancy MS: Denies joint pain, muscle weakness, cramps, or limitation of movement.  Derm: Denies rash, itching, dry skin Psych: Denies depression, anxiety, memory loss, and confusion  Heme: Denies bruising, bleeding, and enlarged lymph nodes.  Physical Exam: BP 111/80   Pulse 85   Temp 97.7 F (36.5 C) (Oral)   Ht 5' 10"  (1.778 m)   Wt 203 lb 12.8 oz (92.4 kg)   BMI 29.24 kg/m  General:   Alert and oriented. Pleasant and cooperative. Well-nourished and well-developed.  Head:  Normocephalic and atraumatic. Eyes:  Without icterus, sclera clear and conjunctiva pink.  Ears:  Normal auditory acuity. Nose:  No deformity, discharge,  or lesions. Mouth:  No deformity or lesions, oral mucosa pink.  Lungs:  Mild expiratory wheeze bilaterally Heart:  S1, S2 present without murmurs appreciated.  Abdomen:  +BS, soft, non-tender and non-distended. No HSM noted. No guarding or rebound. No masses appreciated.  Rectal:  Deferred  Msk:  Symmetrical without gross deformities. Normal posture. Extremities:  Without edema. Prominent lower extremity varicosities Neurologic:  Alert and  oriented x4;  grossly normal neurologically. Psych:  Alert and cooperative. Normal mood and affect.

## 2019-06-11 NOTE — Patient Instructions (Signed)
We have arranged a colonoscopy in the near future with Dr. Gala Romney.  Further recommendations to follow!  It was a pleasure to see you today. I strive to create trusting relationships with patients to provide genuine, compassionate, and quality care. I value your feedback. If you receive a survey regarding your visit,  I greatly appreciate you taking time to fill this out.   Annitta Needs, PhD, ANP-BC [facility]

## 2019-06-11 NOTE — Assessment & Plan Note (Signed)
58 year old male with need for routine screening colonoscopy, with last in remote past in [place]. No family history of colorectal cancer or polyps. As of note, referral from PCP mentioned some concern for abdominal abnormality with palpation. I am not able to appreciate any left-sided mass as noted on documentation. Due to daily beer intake, will pursue screening with Propofol.  Proceed with TCS with Dr. Gala Romney in near future: the risks, benefits, and alternatives have been discussed with the patient in detail. The patient states understanding and desires to proceed. Propofol due to daily ETOH

## 2019-06-12 ENCOUNTER — Telehealth: Payer: Self-pay

## 2019-06-12 NOTE — Telephone Encounter (Signed)
TCS w/Propofol w/RMR was scheduled for 09/01/19 at 7:30am during OV yesterday. Endo scheduler advised another pt was already scheduled for 7:30am. Procedure time changed to 9:00am. Tried to call pt, call unable to be completed. Pre-op appt 08/28/19 at 1:45pm. Pre-op appt letter mailed with procedure instructions. Placed note on procedure instructions to let him know procedure time was changed to 9:00am.

## 2019-06-12 NOTE — Progress Notes (Signed)
cc'd to pcp 

## 2019-06-19 ENCOUNTER — Telehealth: Payer: Self-pay

## 2019-06-19 NOTE — Telephone Encounter (Signed)
Attempted to submit PA for TCS via [facility] website. "Patient not eligible, Status: AAA, Tracking# [id number]".  [facility], [place] required if procedure is done at an outpatient facility. Submitted PA. Ref# [id number]. Faxed clinical notes to [phone].

## 2019-06-26 NOTE — Telephone Encounter (Signed)
[employer] company to f/u on [place]. Case approved. PA# [id number], valid 09/01/19-11/30/19.

## 2019-08-27 ENCOUNTER — Encounter (HOSPITAL_COMMUNITY): Payer: Self-pay

## 2019-08-27 ENCOUNTER — Other Ambulatory Visit: Payer: Self-pay

## 2019-08-28 ENCOUNTER — Encounter (HOSPITAL_COMMUNITY)
Admission: RE | Admit: 2019-08-28 | Discharge: 2019-08-28 | Disposition: A | Discharge status: Home / Self Care | Payer: No Typology Code available for payment source | Source: Ambulatory Visit | Attending: Internal Medicine | Admitting: Internal Medicine

## 2019-08-28 ENCOUNTER — Other Ambulatory Visit (HOSPITAL_COMMUNITY)
Admission: RE | Admit: 2019-08-28 | Discharge: 2019-08-28 | Disposition: A | Discharge status: Home / Self Care | Payer: No Typology Code available for payment source | Source: Ambulatory Visit | Attending: Internal Medicine | Admitting: Internal Medicine

## 2019-08-28 DIAGNOSIS — Z20828 Contact with and (suspected) exposure to other viral communicable diseases: Secondary | ICD-10-CM | POA: Insufficient documentation

## 2019-08-28 DIAGNOSIS — Z01812 Encounter for preprocedural laboratory examination: Secondary | ICD-10-CM | POA: Insufficient documentation

## 2019-08-28 LAB — SARS CORONAVIRUS 2 (TAT 6-24 HRS): SARS Coronavirus 2: NEGATIVE

## 2019-08-29 ENCOUNTER — Telehealth: Payer: Self-pay | Admitting: *Deleted

## 2019-08-29 NOTE — Telephone Encounter (Signed)
Pt called in and said that [facility] won't have his prep kit until Monday which is the day of his procedure.  Pt requested [place] to send to another pharmacy.  After calling around, [place] had it.  Tried to call pt back multiple times with no answer.  Spoke with Doren Custard at [facility] and he is holding a prep kit for pt. Left several messages for pt that he could pick the prep up at [facility].

## 2019-09-01 ENCOUNTER — Encounter (HOSPITAL_COMMUNITY): Payer: Self-pay | Admitting: Anesthesiology

## 2019-09-01 ENCOUNTER — Encounter (HOSPITAL_COMMUNITY): Admission: RE | Payer: Self-pay | Source: Home / Self Care

## 2019-09-01 ENCOUNTER — Telehealth: Payer: Self-pay

## 2019-09-01 ENCOUNTER — Ambulatory Visit (HOSPITAL_COMMUNITY)
Admission: RE | Admit: 2019-09-01 | Payer: No Typology Code available for payment source | Source: Home / Self Care | Admitting: Internal Medicine

## 2019-09-01 SURGERY — COLONOSCOPY WITH PROPOFOL
Anesthesia: Monitor Anesthesia Care

## 2019-09-01 NOTE — Telephone Encounter (Signed)
Received VM from endo scheduler. Pt had called and said the pharmacy was out of his prep and would not be able to get it until 09/01/19 so pt needs to be rescheduled for TCS w/Propofol w/RMR. Pt has new phone number [phone].  Tried to call pt, call went straight to VM, LMOVM for return call.

## 2019-09-03 NOTE — Telephone Encounter (Signed)
Tried to call pt, call went straight to VM, LMOVM for return call. 

## 2019-09-03 NOTE — Telephone Encounter (Signed)
Letter mailed to pt.  

## 2020-03-18 ENCOUNTER — Other Ambulatory Visit: Payer: Self-pay | Admitting: Family Medicine

## 2020-03-18 ENCOUNTER — Other Ambulatory Visit (HOSPITAL_COMMUNITY): Payer: Self-pay | Admitting: Family Medicine

## 2020-03-18 DIAGNOSIS — R202 Paresthesia of skin: Secondary | ICD-10-CM

## 2020-03-18 DIAGNOSIS — M25512 Pain in left shoulder: Secondary | ICD-10-CM

## 2020-03-19 ENCOUNTER — Encounter: Payer: Self-pay | Admitting: Internal Medicine

## 2020-04-09 ENCOUNTER — Ambulatory Visit (HOSPITAL_COMMUNITY)
Admission: RE | Admit: 2020-04-09 | Discharge: 2020-04-09 | Disposition: A | Discharge status: Home / Self Care | Payer: No Typology Code available for payment source | Source: Ambulatory Visit | Attending: Family Medicine | Admitting: Family Medicine

## 2020-04-09 ENCOUNTER — Other Ambulatory Visit: Payer: Self-pay

## 2020-04-09 DIAGNOSIS — R202 Paresthesia of skin: Secondary | ICD-10-CM | POA: Insufficient documentation

## 2020-04-09 DIAGNOSIS — M25512 Pain in left shoulder: Secondary | ICD-10-CM | POA: Insufficient documentation

## 2020-04-12 ENCOUNTER — Telehealth: Payer: Self-pay | Admitting: *Deleted

## 2020-04-12 NOTE — Telephone Encounter (Signed)
Pt consented to a virtual visit. 

## 2020-04-12 NOTE — Telephone Encounter (Signed)
Aaron Nolan, you are scheduled for a virtual visit with your provider today.  Just as we do with appointments in the office, we must obtain your consent to participate.  Your consent will be active for this visit and any virtual visit you may have with one of our providers in the next 365 days.  If you have a MyChart account, I can also send a copy of this consent to you electronically.  All virtual visits are billed to your insurance company just like a traditional visit in the office.  As this is a virtual visit, video technology does not allow for your provider to perform a traditional examination.  This may limit your provider's ability to fully assess your condition.  If your provider identifies any concerns that need to be evaluated in person or the need to arrange testing such as labs, EKG, etc, we will make arrangements to do so.  Although advances in technology are sophisticated, we cannot ensure that it will always work on either your end or our end.  If the connection with a video visit is poor, we may have to switch to a telephone visit.  With either a video or telephone visit, we are not always able to ensure that we have a secure connection.   I need to obtain your verbal consent now.   Are you willing to proceed with your visit today?   

## 2020-04-14 ENCOUNTER — Encounter: Payer: Self-pay | Admitting: Internal Medicine

## 2020-04-15 ENCOUNTER — Telehealth: Payer: No Typology Code available for payment source | Admitting: Nurse Practitioner

## 2020-05-14 ENCOUNTER — Encounter: Payer: Self-pay | Admitting: Gastroenterology

## 2020-05-14 ENCOUNTER — Telehealth (INDEPENDENT_AMBULATORY_CARE_PROVIDER_SITE_OTHER): Payer: No Typology Code available for payment source | Admitting: Gastroenterology

## 2020-05-14 ENCOUNTER — Telehealth: Payer: Self-pay | Admitting: *Deleted

## 2020-05-14 DIAGNOSIS — Z1211 Encounter for screening for malignant neoplasm of colon: Secondary | ICD-10-CM | POA: Diagnosis not present

## 2020-05-14 NOTE — Patient Instructions (Signed)
We are arranging a colonoscopy in the near future.  As we talked about, definitely get the prep as soon as you know the date so that the pharmacy has time to order if needed.  Further recommendations to follow!  I enjoyed seeing you again today! As you know, I value our relationship and want to provide genuine, compassionate, and quality care. I welcome your feedback. If you receive a survey regarding your visit,  I greatly appreciate you taking time to fill this out. See you next time!  Annitta Needs, PhD, ANP-BC [facility]

## 2020-05-14 NOTE — Telephone Encounter (Signed)
Aaron Nolan, you are scheduled for a virtual visit with your provider today.  Just as we do with appointments in the office, we must obtain your consent to participate.  Your consent will be active for this visit and any virtual visit you may have with one of our providers in the next 365 days.  If you have a MyChart account, I can also send a copy of this consent to you electronically.  All virtual visits are billed to your insurance company just like a traditional visit in the office.  As this is a virtual visit, video technology does not allow for your provider to perform a traditional examination.  This may limit your provider's ability to fully assess your condition.  If your provider identifies any concerns that need to be evaluated in person or the need to arrange testing such as labs, EKG, etc, we will make arrangements to do so.  Although advances in technology are sophisticated, we cannot ensure that it will always work on either your end or our end.  If the connection with a video visit is poor, we may have to switch to a telephone visit.  With either a video or telephone visit, we are not always able to ensure that we have a secure connection.   I need to obtain your verbal consent now.   Are you willing to proceed with your visit today?

## 2020-05-14 NOTE — Progress Notes (Signed)
Referring Provider: Delman Cheadle, PA-C Primary Care Physician:  Jake Samples, PA-C  Primary GI: Dr. Gala Romney   Patient Location: Home   Provider Location: [facility] office   Reason for Visit: Arrange colonoscopy   Persons present on the virtual encounter, with roles: Patient and NP    Due to COVID-19, visit was conducted using virtual method.  Visit was requested by patient.  Virtual Visit via MyChart Video Note Due to COVID-19, visit is conducted virtually and was requested by patient.   I connected with Aaron Nolan on 05/14/20 at  9:30 AM EDT by telephone and verified that I am speaking with the correct person using two identifiers.   I discussed the limitations, risks, security and privacy concerns of performing an evaluation and management service by telephone and the availability of in person appointments. I also discussed with the patient that there may be a patient responsible charge related to this service. The patient expressed understanding and agreed to proceed.  Chief Complaint  Patient presents with  . Colonoscopy     History of Present Illness: 59 year old male with need for routine screening colonoscopy, with last in remote past in [place]. No known history of polyps. No family history of colorectal cancer or polyps.   States his prescription wasn't at the pharmacy when he called and had to be ordered last time. Wasn't in stock and had to order it. Rare reflux if eating something he shouldn't have. No dysphagia. No abdominal pain, no N/V. No rectal bleeding. No changes in bowel habits. No constipation or diarrhea. No GI concerns today. Drinks 2 beers a day and a few more on weekends.   Past Medical History:  Diagnosis Date  . Hypothyroidism    hypothyroidism  . Varicose veins      Past Surgical History:  Procedure Laterality Date  . broken ribs    . collar bone broken    . ENDOVENOUS ABLATION SAPHENOUS VEIN W/ LASER Left 06-01-2016   endovenous  laser ablation left greater saphenous vein and stab phlebectomy >20 incisions left leg by Curt Jews MD  . ENDOVENOUS ABLATION SAPHENOUS VEIN W/ LASER Right 07/20/2016   endovenous laser ablation right greater saphenous vein and stab phlebectomy 10-20 incisions right leg by Curt Jews MD  . KNEE SURGERY       Current Meds  Medication Sig  . ibuprofen (ADVIL) 200 MG tablet Take 200 mg by mouth as needed. Takes 2 tablets as needed.     Family History  Problem Relation Age of Onset  . Parkinson's disease Father   . Colon cancer Neg Hx   . Colon polyps Neg Hx     Social History   Socioeconomic History  . Marital status: Married    Spouse name: Not on file  . Number of children: Not on file  . Years of education: Not on file  . Highest education level: Not on file  Occupational History  . Not on file  Tobacco Use  . Smoking status: Current Every Day Smoker    Packs/day: 0.50    Types: Cigarettes  . Smokeless tobacco: Never Used  Substance and Sexual Activity  . Alcohol use: Yes    Alcohol/week: 0.0 standard drinks    Comment: 2 12oz beers/day, average 2.5 on weekend.   . Drug use: No  . Sexual activity: Not on file  Other Topics Concern  . Not on file  Social History Narrative  . Not on file   Social Determinants  of Health   Financial Resource Strain:   . Difficulty of Paying Living Expenses:   Food Insecurity:   . Worried About Charity fundraiser in the Last Year:   . Arboriculturist in the Last Year:   Transportation Needs:   . Film/video editor (Medical):   Marland Kitchen Lack of Transportation (Non-Medical):   Physical Activity:   . Days of Exercise per Week:   . Minutes of Exercise per Session:   Stress:   . Feeling of Stress :   Social Connections:   . Frequency of Communication with Friends and Family:   . Frequency of Social Gatherings with Friends and Family:   . Attends Religious Services:   . Active Member of Clubs or Organizations:   . Attends Theatre manager Meetings:   Marland Kitchen Marital Status:        Review of Systems: Gen: Denies fever, chills, anorexia. Denies fatigue, weakness, weight loss.  CV: Denies chest pain, palpitations, syncope, peripheral edema, and claudication. Resp: Denies dyspnea at rest, cough, wheezing, coughing up blood, and pleurisy. GI: see HPI Derm: Denies rash, itching, dry skin Psych: Denies depression, anxiety, memory loss, confusion. No homicidal or suicidal ideation.  Heme: Denies bruising, bleeding, and enlarged lymph nodes.  Observations/Objective: No distress. Unable to perform physical exam due to video encounter. Well-dressed, in good spirits.   Assessment and Plan: Pleasant 59 year old male with need for routine screening colonoscopy, as last was in remote past in [place] and reportedly normal. No family history of colorectal cancer or polyps. No concerning upper or lower GI signs/symptoms. Due to daily alcohol, will pursue with Propofol.  Proceed with TCS with Dr. Gala Romney in near future using Propofl: the risks, benefits, and alternatives have been discussed with the patient in detail. The patient states understanding and desires to proceed.   Follow Up Instructions: See AVS   I discussed the assessment and treatment plan with the patient. The patient was provided an opportunity to ask questions and all were answered. The patient agreed with the plan and demonstrated an understanding of the instructions.   The patient was advised to call back or seek an in-person evaluation if the symptoms worsen or if the condition fails to improve as anticipated.    Annitta Needs, PhD, ANP-BC [facility]

## 2020-05-14 NOTE — Telephone Encounter (Signed)
Pt consented to a virtual visit. 

## 2020-05-14 NOTE — Telephone Encounter (Addendum)
Pt cancelled virtual visit for 04/15/20 and rescheduled virtual visit to 05/14/20.

## 2020-05-17 NOTE — Progress Notes (Signed)
Cc'ed to pcp °

## 2020-06-14 ENCOUNTER — Telehealth: Payer: Self-pay | Admitting: *Deleted

## 2020-06-14 NOTE — Telephone Encounter (Signed)
LMOVM to schedule TCS with propofol with RMR

## 2020-06-15 NOTE — Telephone Encounter (Signed)
Tried to call pt to schedule TCS, no answer, LMOVM for return call. Letter mailed.

## 2020-07-02 ENCOUNTER — Telehealth: Payer: Self-pay

## 2020-07-02 NOTE — Telephone Encounter (Signed)
Pt called to schedule his procedure. Please call pts spouses number [phone]. Pt isn't able to receive voice mail messages on his cell number. Pt also says he'll need his prep sent to his pharmacy as well.

## 2020-07-02 NOTE — Telephone Encounter (Signed)
Called and informed pt's wife we will call to schedule TCS when September schedule is available. She wants to be called to schedule procedure (note placed on encounter form).

## 2020-07-12 ENCOUNTER — Telehealth: Payer: Self-pay | Admitting: *Deleted

## 2020-07-12 NOTE — Telephone Encounter (Signed)
LMOVM to schedule TCS with Dr. Gala Romney with propofol, asa 3

## 2021-09-08 ENCOUNTER — Encounter: Payer: Self-pay | Admitting: Internal Medicine

## 2021-12-21 IMAGING — MR MR SHOULDER*L* W/O CM
5 of 6 series · 37 of 40 positions shown · non-contrast
Comparison: None.

CLINICAL DATA: Left shoulder pain. Left arm paresthesia.

EXAM:
MRI OF THE LEFT SHOULDER WITHOUT CONTRAST
TECHNIQUE: Multiplanar, multisequence MR imaging of the shoulder was performed.
No intravenous contrast was administered.

[Series 6: PD fat-sat · axial · left · 4.0mm · 0.62mm/px · z∈[-114,+2]mm · 8 of 30 slices shown (1 of 2)]
[im 1/30]
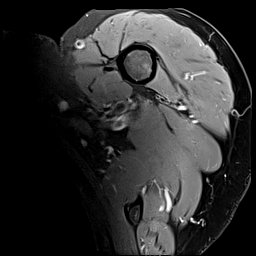
[im 5/30]
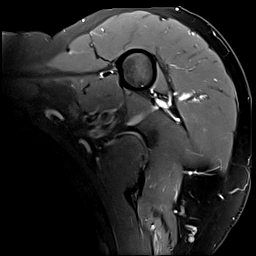
[im 9/30]
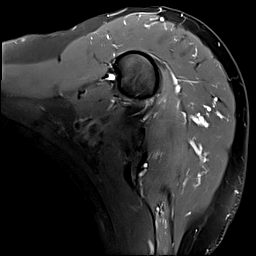
[im 13/30]
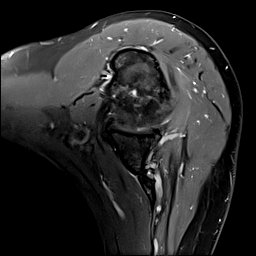
[im 17/30]
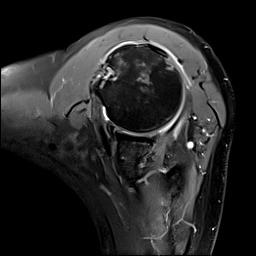
[im 21/30]
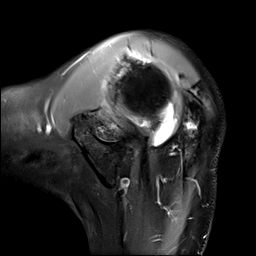
[im 25/30]
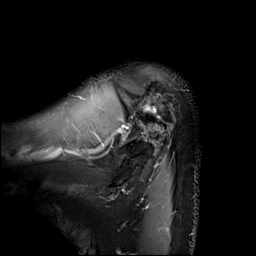
[im 30/30]
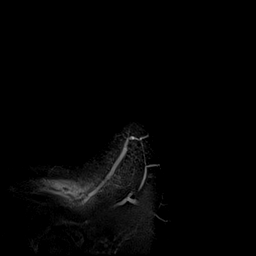

[Series 7: T2 fat-sat · oblique · left · 4.0mm · 0.50mm/px · 5 of 20 slices shown (1 of 2)]
[im 1/20]
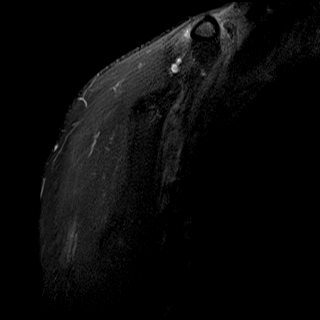
[im 5/20]
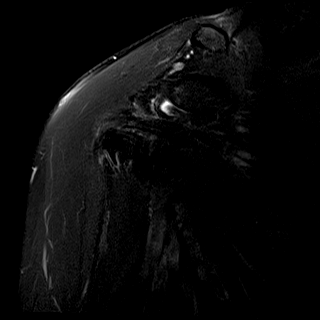
[im 10/20]
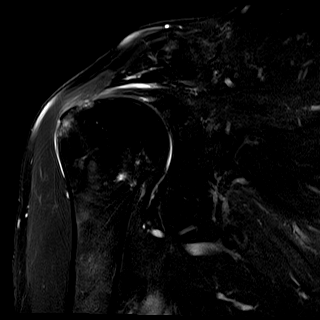
[im 15/20]
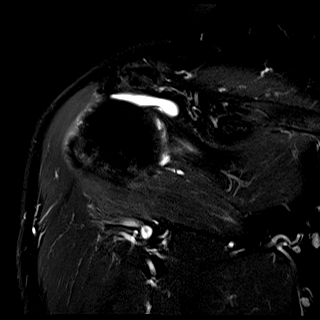
[im 20/20]
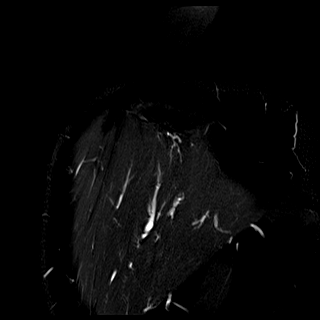

[Series 8: PD fat-sat · oblique · left · 4.0mm · 0.50mm/px · 5 of 20 slices shown (2 of 2)]
[im 1/20]
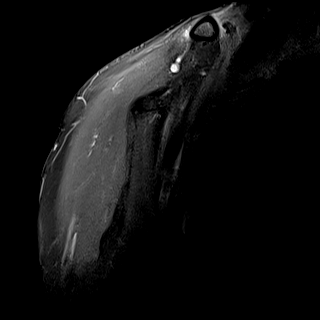
[im 5/20]
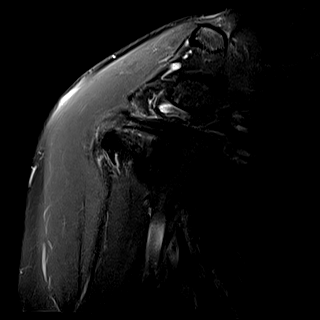
[im 10/20]
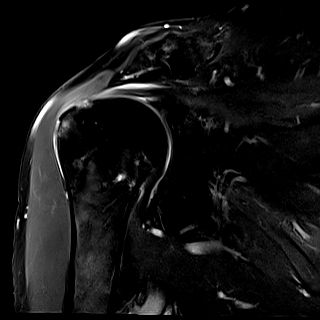
[im 15/20]
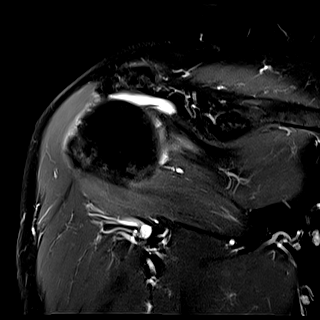
[im 20/20]
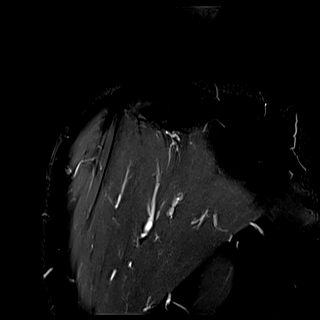

[Series 9: T1 · oblique · left · 3.0mm · 0.59mm/px · 10 of 36 slices shown]
[im 1/36]
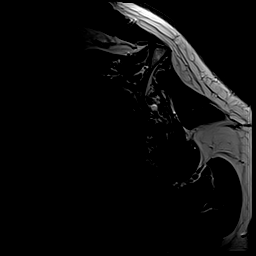
[im 4/36]
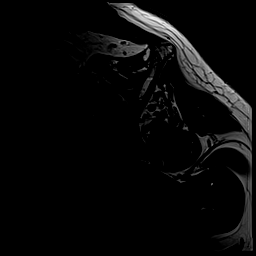
[im 8/36]
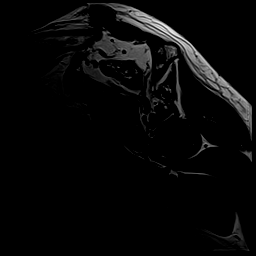
[im 12/36]
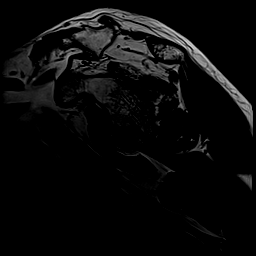
[im 16/36]
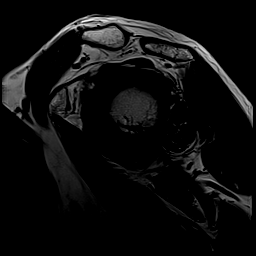
[im 20/36]
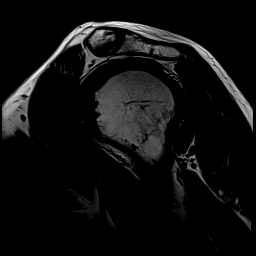
[im 24/36]
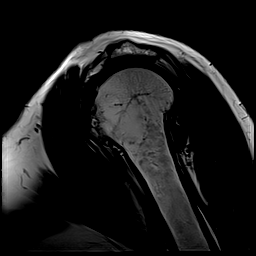
[im 28/36]
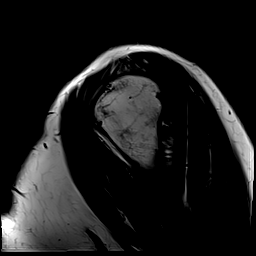
[im 32/36]
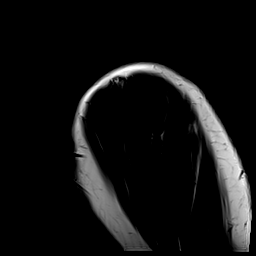
[im 36/36]
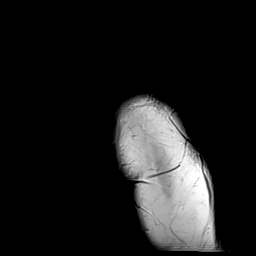

[Series 10: T2 fat-sat · oblique · left · 3.0mm · 0.59mm/px · 9 of 35 slices shown (2 of 2)]
[im 1/35]
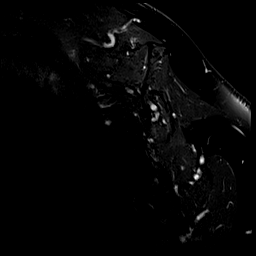
[im 5/35]
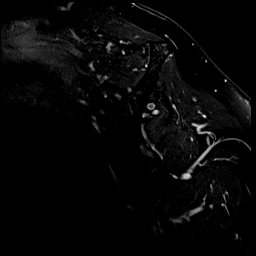
[im 9/35]
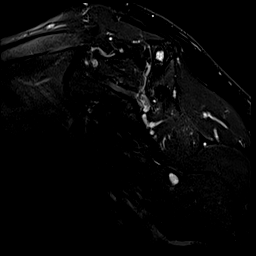
[im 13/35]
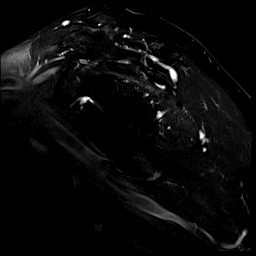
[im 18/35]
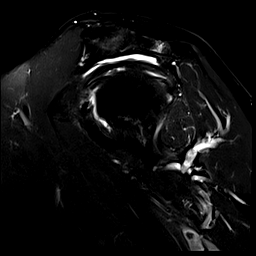
[im 22/35]
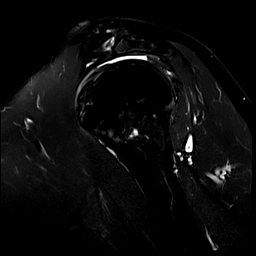
[im 26/35]
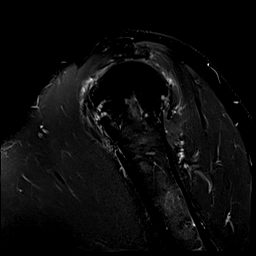
[im 30/35]
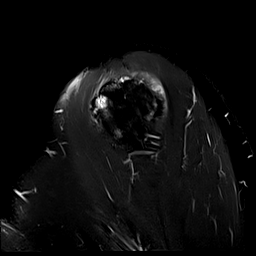
[im 35/35]
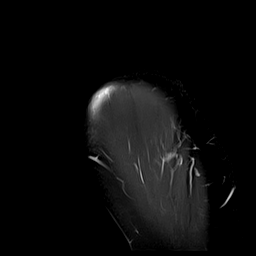

[37 of 40 positions shown; findings below may reference images not displayed]

FINDINGS: Rotator cuff: There is a 3.5 x 4.5 cm full-thickness tear of the
infraspinatus and supraspinatus tendons. There are some intact
anterior fibers of the supraspinatus and some intact posterior
fibers of the infraspinatus. There is a small partial-thickness
articular surface tear of the subscapularis at the insertion on the
lesser tuberosity, best seen on image 15 of series 6. Teres minor
tendon is intact.

Muscles: Moderate atrophy of the infraspinatus and supraspinatus
muscles.

Biceps long head: The proximal tendon is degenerated with a
longitudinal split tear at the superior aspect of the bicipital
groove. No dislocation of the tendon.

Acromioclavicular Joint:  Normal.  Type 2 acromion.

Glenohumeral Joint: Small glenohumeral joint effusion. No chondral
defect.

Labrum:  Intact.

Bones: Slight degenerative changes of the greater and lesser
tuberosities of the proximal humerus. Otherwise negative.

Other: None
IMPRESSION: 1. Large full-thickness tear of the infraspinatus and supraspinatus
tendons.
2. Small partial-thickness articular surface tear of the
subscapularis at the insertion on the lesser tuberosity.
3. Degeneration of the proximal long head of the biceps tendon with
a longitudinal split tear at the superior aspect of the bicipital
groove.

## 2022-02-01 NOTE — Progress Notes (Signed)
? ? ?Referring Provider: Jake Samples, PA* ?Primary Care Physician:  Jake Samples, PA-C ?Primary GI Physician: Dr. Gala Romney ? ?Chief Complaint  ?Patient presents with  ? Colonoscopy  ? ? ?HPI:   ?Aaron Nolan is a 61 y.o. male presenting today to discuss scheduling routine screening colonoscopy, last colonosocpy in the remote past in [place]. No history of polyps and no history of colon cancer.  Last seen via virtual visit in June 2021 for the same.  Unfortunately, we were unable to reach patient after his virtual visit to schedule his colonoscopy. ? ?Today: ?No concerns for me today.  Denies abdominal pain, BRBPR, melena, unintentional weight loss, constipation, diarrhea.  Occasional reflux if drinking tomato juice and going to bed right after, but this is infrequent.  No dysphagia, nausea, or vomiting. ? 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  ? ?Reports mother was recently found to have several colon polyps at age 21.  Previously without colon polyps.  No family history of colon cancer. ? ?Discussed wheezing after physical exam completed.  Patient denies history of COPD.  Reports she has had wheezing in the past and does notices from time to time, but does not have any trouble with shortness of breath or cough.  His PCP did prescribe him an inhaler at 1 point which was a steroid.  Reports he took this 1 time and ended up having chest pain.  He was told not to take this again.  He does not feel he needs an inhaler at this time. ? ?Reviewed blood work included in referral.  No significant abnormalities on CBC or CMP.  Hemoglobin LFTs within normal limits. ? ?Past Medical History:  ?Diagnosis Date  ? Cyst of larynx   ? remote past. Removed  ? Hypothyroidism   ? patient states resolved?   ? Varicose veins   ? ? ?Past Surgical History:  ?Procedure Laterality Date  ? broken ribs    ? collar bone broken    ? COLONOSCOPY    ? [place], no polyps per patient.  ? ENDOVENOUS ABLATION  SAPHENOUS VEIN W/ LASER Left 06/01/2016  ? endovenous laser ablation left greater saphenous vein and stab phlebectomy >20 incisions left leg by Curt Jews MD  ? ENDOVENOUS ABLATION SAPHENOUS VEIN W/ LASER Right 07/20/2016  ? endovenous laser ablation right greater saphenous vein and stab phlebectomy 10-20 incisions right leg by Curt Jews MD  ? KNEE SURGERY    ? ? ?Current Outpatient Medications  ?Medication Sig Dispense Refill  ? ibuprofen (ADVIL) 200 MG tablet Take 200 mg by mouth as needed. Takes 2 tablets as needed.    ? levothyroxine (SYNTHROID) 25 MCG tablet Take 25 mcg by mouth daily before breakfast.    ? ?No current facility-administered medications for this visit.  ? ? ?Allergies as of 02/02/2022 - Review Complete 02/02/2022  ?Allergen Reaction Noted  ? Tetanus toxoids  06/11/2019  ? ? ?Family History  ?Problem Relation Age of Onset  ? Colon polyps Mother   ?     2 y.o.  ? Parkinson's disease Father   ? Colon cancer Neg Hx   ? ? ?Social History  ? ?Socioeconomic History  ? Marital status: Married  ?  Spouse name: Not on file  ? Number of children: Not on file  ? Years of education: Not on file  ? Highest education level: Not on file  ?Occupational History  ? Not on file  ?Tobacco Use  ? Smoking status:  Every Day  ?  Packs/day: 0.25  ?  Types: Cigarettes  ? Smokeless tobacco: Never  ?Substance and Sexual Activity  ? Alcohol use: Yes  ?  Alcohol/week: 0.0 standard drinks  ?  Comment: 2 12oz beers/day, average 2.5 on weekend.   ? Drug use: No  ? Sexual activity: Not on file  ?Other Topics Concern  ? Not on file  ?Social History Narrative  ? Not on file  ? ?Social Determinants of Health  ? ?Financial Resource Strain: Not on file  ?Food Insecurity: Not on file  ?Transportation Needs: Not on file  ?Physical Activity: Not on file  ?Stress: Not on file  ?Social Connections: Not on file  ? ? ?Review of Systems: ?Gen: Denies fever, chills, cold or flulike symptoms, presyncope, syncope. ?CV: Denies chest pain,  palpitations. ?Resp: Denies dyspnea or cough. ?GI: See HPI ?Heme: See HPI ? ?Physical Exam: ?BP 120/66   Pulse 84   Temp 97.9 ?F (36.6 ?C) (Temporal)   Ht 5\' 10"  (1.778 m)   Wt 225 lb 12.8 oz (102.4 kg)   BMI 32.40 kg/m?  ?General:   Alert and oriented. No distress noted. Pleasant and cooperative.  ?Head:  Normocephalic and atraumatic. ?Eyes:  Conjuctiva clear without scleral icterus. ?Heart:  S1, S2 present without murmurs appreciated. ?Lungs: Wheezing throughout all lung fields.  No rales or rhonchi.  No distress.  ?Abdomen:  +BS, soft, non-tender and non-distended. No rebound or guarding. No HSM or masses noted. ?Msk:  Symmetrical without gross deformities. Normal posture. ?Extremities:  Without edema. ?Neurologic:  Alert and  oriented x4 ?Psych: Normal mood and affect.  ? ? ?Assessment: ?61 year old male with history of hypothyroidism, daily alcohol use, presenting today to discuss scheduling routine screening colonoscopy.  He reports his last colonoscopy was many years ago in [place].  Denies history of colon polyps.  No family history of colon cancer.  Mother recently found to have multiple colon polyps at age 32, previously no polyps.  No significant GI symptoms.  No alarm symptoms. ? ?Notably, on exam, I do note wheezing throughout all lung fields.  No known history of COPD, but he is a chronic smoker.  Suspect he has some underlying COPD.  Reports his PCP is aware of this and has prescribed him an inhaler in the past, but does not feel he needs one at this time.  ? ?Plan: ?Proceed with colonoscopy with propofol with Dr. Gala Romney in the near future.The risks, benefits, and alternatives have been discussed with the patient in detail. The patient states understanding and desires to proceed. ?ASA 3 ?Follow-up as needed ? ? ?Aliene Altes, PA-C ?[facility] ?02/02/2022 ? ?

## 2022-02-02 ENCOUNTER — Other Ambulatory Visit: Payer: Self-pay

## 2022-02-02 ENCOUNTER — Ambulatory Visit (INDEPENDENT_AMBULATORY_CARE_PROVIDER_SITE_OTHER): Payer: No Typology Code available for payment source | Admitting: Gastroenterology

## 2022-02-02 ENCOUNTER — Encounter: Payer: Self-pay | Admitting: Gastroenterology

## 2022-02-02 VITALS — BP 120/66 | HR 84 | Temp 97.9°F | Ht 70.0 in | Wt 225.8 lb

## 2022-02-02 DIAGNOSIS — Z1211 Encounter for screening for malignant neoplasm of colon: Secondary | ICD-10-CM | POA: Diagnosis not present

## 2022-02-02 DIAGNOSIS — Z83719 Family history of colon polyps, unspecified: Secondary | ICD-10-CM

## 2022-02-02 DIAGNOSIS — Z8371 Family history of colonic polyps: Secondary | ICD-10-CM | POA: Diagnosis not present

## 2022-02-02 DIAGNOSIS — R062 Wheezing: Secondary | ICD-10-CM

## 2022-02-02 NOTE — Patient Instructions (Signed)
We will arrange for you to have a colonoscopy in the near future with Dr. Gala Romney. ? ?We will follow-up with you as needed.  Do not hesitate to call if you have any new GI concerns such as blood in your stools, black stools, or changes in bowel habits. ? ?It was a pleasure meeting you today! ? ?Aliene Altes, PA-C ?[facility] ? ?

## 2022-02-09 ENCOUNTER — Telehealth: Payer: Self-pay

## 2022-02-09 MED ORDER — PEG 3350-KCL-NA BICARB-NACL 420 G PO SOLR: 4000.0000 mL | ORAL | 0 refills | Status: AC

## 2022-02-09 NOTE — Telephone Encounter (Signed)
Tried to call pt's wife to schedule TCS w/Propofol ASA 3 w/Dr. Gala Romney, LMOVM for return call. ?

## 2022-02-09 NOTE — Addendum Note (Signed)
Addended by: Zara Council C on: 02/09/2022 12:15 PM ? ? Modules accepted: Orders ? ?

## 2022-02-09 NOTE — Telephone Encounter (Signed)
Pre-op appt 03/20/22. Appt letter mailed with procedure instructions. ?

## 2022-02-09 NOTE — Telephone Encounter (Signed)
Spoke to wife, TCS scheduled for 03/23/22 at 7:30am. Rx for prep sent to pharmacy. ?

## 2022-02-09 NOTE — Telephone Encounter (Signed)
Orders entered for procedure. ? ?PA for TCS submitted via [facility] website. Notification/prior authorization reference# [id number]. Unable to upload clinicals at this time due to ROI save to file access is denied. ?

## 2022-02-10 ENCOUNTER — Other Ambulatory Visit: Payer: Self-pay

## 2022-02-10 NOTE — Telephone Encounter (Signed)
TCS approved. PA# [id number], valid 03/23/22-06/21/22. ?

## 2022-03-15 NOTE — Patient Instructions (Signed)
? ? ? ? Aaron Nolan ? 03/15/2022  ?  ? '@PREFPERIOPPHARMACY'$ @ ? ? Your procedure is scheduled on  03/23/2022. ? ? Report to [name] at  0600  A.M. ? ? Call this number if you have problems the morning of surgery: ? [phone] ? ? Remember: ? Follow the diet and prep instructions given to you by the office. ?  ? Take these medicines the morning of surgery with A SIP OF WATER   ? ?levothyroxine. ? ?  ? Do not wear jewelry, make-up or nail polish. ? Do not wear lotions, powders, or perfumes, or deodorant. ? Do not shave 48 hours prior to surgery.  Men may shave face and neck. ? Do not bring valuables to the hospital. ? [facility] is not responsible for any belongings or valuables. ? ?Contacts, dentures or bridgework may not be worn into surgery.  Leave your suitcase in the car.  After surgery it may be brought to your room. ? ?For patients admitted to the hospital, discharge time will be determined by your treatment team. ? ?Patients discharged the day of surgery will not be allowed to drive home and must have someone with them for 24 hours.  ? ? ?Special instructions:   DO NOT smoke tobacco or vape for 24 hours before your procedure. ? ?Please read over the following fact sheets that you were given. ?Anesthesia Post-op Instructions and Care and Recovery After Surgery ?  ? ? ? Colonoscopy, Adult, Care After ?This sheet gives you information about how to care for yourself after your procedure. Your health care provider may also give you more specific instructions. If you have problems or questions, contact your health care provider. ?What can I expect after the procedure? ?After the procedure, it is common to have: ?A small amount of blood in your stool for 24 hours after the procedure. ?Some gas. ?Mild cramping or bloating of your abdomen. ?Follow these instructions at home: ?Eating and drinking ? ?Drink enough fluid to keep your urine pale yellow. ?Follow instructions from your health care provider about eating  or drinking restrictions. ?Resume your normal diet as instructed by your health care provider. Avoid heavy or fried foods that are hard to digest. ?Activity ?Rest as told by your health care provider. ?Avoid sitting for a long time without moving. Get up to take short walks every 1-2 hours. This is important to improve blood flow and breathing. Ask for help if you feel weak or unsteady. ?Return to your normal activities as told by your health care provider. Ask your health care provider what activities are safe for you. ?Managing cramping and bloating ? ?Try walking around when you have cramps or feel bloated. ?Apply heat to your abdomen as told by your health care provider. Use the heat source that your health care provider recommends, such as a moist heat pack or a heating pad. ?Place a towel between your skin and the heat source. ?Leave the heat on for 20-30 minutes. ?Remove the heat if your skin turns bright red. This is especially important if you are unable to feel pain, heat, or cold. You may have a greater risk of getting burned. ?General instructions ?If you were given a sedative during the procedure, it can affect you for several hours. Do not drive or operate machinery until your health care provider says that it is safe. ?For the first 24 hours after the procedure: ?Do not sign important documents. ?Do not drink alcohol. ?Do your regular daily  activities at a slower pace than normal. ?Eat soft foods that are easy to digest. ?Take over-the-counter and prescription medicines only as told by your health care provider. ?Keep all follow-up visits as told by your health care provider. This is important. ?Contact a health care provider if: ?You have blood in your stool 2-3 days after the procedure. ?Get help right away if you have: ?More than a small spotting of blood in your stool. ?Large blood clots in your stool. ?Swelling of your abdomen. ?Nausea or vomiting. ?A fever. ?Increasing pain in your abdomen that  is not relieved with medicine. ?Summary ?After the procedure, it is common to have a small amount of blood in your stool. You may also have mild cramping and bloating of your abdomen. ?If you were given a sedative during the procedure, it can affect you for several hours. Do not drive or operate machinery until your health care provider says that it is safe. ?Get help right away if you have a lot of blood in your stool, nausea or vomiting, a fever, or increased pain in your abdomen. ?This information is not intended to replace advice given to you by your health care provider. Make sure you discuss any questions you have with your health care provider. ?Document Revised: 09/26/2019 Document Reviewed: 06/16/2019 ?Elsevier Patient Education ? [place]. ? ?Monitored Anesthesia Care, Care After ?This sheet gives you information about how to care for yourself after your procedure. Your health care provider may also give you more specific instructions. If you have problems or questions, contact your health care provider. ?What can I expect after the procedure? ?After the procedure, it is common to have: ?Tiredness. ?Forgetfulness about what happened after the procedure. ?Impaired judgment for important decisions. ?Nausea or vomiting. ?Some difficulty with balance. ?Follow these instructions at home: ?For the time period you were told by your health care provider: ?  ?Rest as needed. ?Do not participate in activities where you could fall or become injured. ?Do not drive or use machinery. ?Do not drink alcohol. ?Do not take sleeping pills or medicines that cause drowsiness. ?Do not make important decisions or sign legal documents. ?Do not take care of children on your own. ?Eating and drinking ?Follow the diet that is recommended by your health care provider. ?Drink enough fluid to keep your urine pale yellow. ?If you vomit: ?Drink water, juice, or soup when you can drink without vomiting. ?Make sure you have little  or no nausea before eating solid foods. ?General instructions ?Have a responsible adult stay with you for the time you are told. It is important to have someone help care for you until you are awake and alert. ?Take over-the-counter and prescription medicines only as told by your health care provider. ?If you have sleep apnea, surgery and certain medicines can increase your risk for breathing problems. Follow instructions from your health care provider about wearing your sleep device: ?Anytime you are sleeping, including during daytime naps. ?While taking prescription pain medicines, sleeping medicines, or medicines that make you drowsy. ?Avoid smoking. ?Keep all follow-up visits as told by your health care provider. This is important. ?Contact a health care provider if: ?You keep feeling nauseous or you keep vomiting. ?You feel light-headed. ?You are still sleepy or having trouble with balance after 24 hours. ?You develop a rash. ?You have a fever. ?You have redness or swelling around the IV site. ?Get help right away if: ?You have trouble breathing. ?You have new-onset confusion at home. ?Summary ?  For several hours after your procedure, you may feel tired. You may also be forgetful and have poor judgment. ?Have a responsible adult stay with you for the time you are told. It is important to have someone help care for you until you are awake and alert. ?Rest as told. Do not drive or operate machinery. Do not drink alcohol or take sleeping pills. ?Get help right away if you have trouble breathing, or if you suddenly become confused. ?This information is not intended to replace advice given to you by your health care provider. Make sure you discuss any questions you have with your health care provider. ?Document Revised: 08/05/2020 Document Reviewed: 10/23/2019 ?Elsevier Patient Education ? 2022 [place]. ? ? ?

## 2022-03-20 ENCOUNTER — Encounter (HOSPITAL_COMMUNITY)
Admission: RE | Admit: 2022-03-20 | Discharge: 2022-03-20 | Disposition: A | Discharge status: Home / Self Care | Payer: No Typology Code available for payment source | Source: Ambulatory Visit | Attending: Internal Medicine | Admitting: Internal Medicine

## 2022-03-20 VITALS — BP 136/80 | HR 79 | Temp 98.5°F | Resp 18 | Ht 70.5 in | Wt 210.0 lb

## 2022-03-20 DIAGNOSIS — Z01818 Encounter for other preprocedural examination: Secondary | ICD-10-CM | POA: Diagnosis present

## 2022-03-20 DIAGNOSIS — F1090 Alcohol use, unspecified, uncomplicated: Secondary | ICD-10-CM | POA: Insufficient documentation

## 2022-03-20 LAB — COMPREHENSIVE METABOLIC PANEL
ALT: 14 U/L (ref 0–44)
AST: 17 U/L (ref 15–41)
Albumin: 3.9 g/dL (ref 3.5–5.0)
Alkaline Phosphatase: 44 U/L (ref 38–126)
Anion gap: 8 (ref 5–15)
BUN: 9 mg/dL (ref 6–20)
CO2: 25 mmol/L (ref 22–32)
Calcium: 8.9 mg/dL (ref 8.9–10.3)
Chloride: 105 mmol/L (ref 98–111)
Creatinine, Ser: 0.65 mg/dL (ref 0.61–1.24)
GFR, Estimated: >60 mL/min (ref >60)
Glucose, Bld: 94 mg/dL (ref 70–99)
Potassium: 3.9 mmol/L (ref 3.5–5.1)
Sodium: 138 mmol/L (ref 135–145)
Total Bilirubin: 0.5 mg/dL (ref 0.3–1.2)
Total Protein: 6.9 g/dL (ref 6.5–8.1)

## 2022-03-23 ENCOUNTER — Ambulatory Visit (HOSPITAL_BASED_OUTPATIENT_CLINIC_OR_DEPARTMENT_OTHER): Payer: No Typology Code available for payment source | Admitting: Anesthesiology

## 2022-03-23 ENCOUNTER — Ambulatory Visit (HOSPITAL_COMMUNITY)
Admission: RE | Admit: 2022-03-23 | Discharge: 2022-03-23 | Disposition: A | Discharge status: Home / Self Care | Payer: No Typology Code available for payment source | Attending: Internal Medicine | Admitting: Internal Medicine

## 2022-03-23 ENCOUNTER — Encounter (HOSPITAL_COMMUNITY)
Admission: RE | Disposition: A | Discharge status: Home / Self Care | Payer: Self-pay | Source: Home / Self Care | Attending: Internal Medicine

## 2022-03-23 ENCOUNTER — Encounter (HOSPITAL_COMMUNITY): Payer: Self-pay | Admitting: Internal Medicine

## 2022-03-23 ENCOUNTER — Ambulatory Visit (HOSPITAL_COMMUNITY): Payer: No Typology Code available for payment source | Admitting: Anesthesiology

## 2022-03-23 DIAGNOSIS — Z1211 Encounter for screening for malignant neoplasm of colon: Secondary | ICD-10-CM | POA: Diagnosis present

## 2022-03-23 DIAGNOSIS — K621 Rectal polyp: Secondary | ICD-10-CM

## 2022-03-23 DIAGNOSIS — D128 Benign neoplasm of rectum: Secondary | ICD-10-CM | POA: Diagnosis not present

## 2022-03-23 DIAGNOSIS — K641 Second degree hemorrhoids: Secondary | ICD-10-CM | POA: Diagnosis not present

## 2022-03-23 DIAGNOSIS — Z87891 Personal history of nicotine dependence: Secondary | ICD-10-CM | POA: Diagnosis not present

## 2022-03-23 DIAGNOSIS — D123 Benign neoplasm of transverse colon: Secondary | ICD-10-CM | POA: Insufficient documentation

## 2022-03-23 DIAGNOSIS — K5730 Diverticulosis of large intestine without perforation or abscess without bleeding: Secondary | ICD-10-CM | POA: Diagnosis not present

## 2022-03-23 DIAGNOSIS — K635 Polyp of colon: Secondary | ICD-10-CM

## 2022-03-23 DIAGNOSIS — E039 Hypothyroidism, unspecified: Secondary | ICD-10-CM | POA: Diagnosis not present

## 2022-03-23 HISTORY — PX: POLYPECTOMY: SHX5525

## 2022-03-23 HISTORY — PX: COLONOSCOPY WITH PROPOFOL: SHX5780

## 2022-03-23 SURGERY — COLONOSCOPY WITH PROPOFOL
Anesthesia: General

## 2022-03-23 MED ORDER — LIDOCAINE HCL (PF) 2 % IJ SOLN
INTRAMUSCULAR | Status: AC
  Filled 2022-03-23: qty 5

## 2022-03-23 MED ORDER — PROPOFOL 500 MG/50ML IV EMUL
INTRAVENOUS | Status: DC | PRN
  Administered 2022-03-23: 150 ug/kg/min via INTRAVENOUS

## 2022-03-23 MED ORDER — PROPOFOL 10 MG/ML IV BOLUS
INTRAVENOUS | Status: DC | PRN
  Administered 2022-03-23: 50 mg via INTRAVENOUS
  Administered 2022-03-23: 30 mg via INTRAVENOUS
  Administered 2022-03-23: 50 mg via INTRAVENOUS

## 2022-03-23 MED ORDER — LACTATED RINGERS IV SOLN: INTRAVENOUS | Status: DC

## 2022-03-23 MED ORDER — LIDOCAINE HCL 1 % IJ SOLN
INTRAMUSCULAR | Status: DC | PRN
  Administered 2022-03-23: 50 mg via INTRADERMAL

## 2022-03-23 NOTE — Anesthesia Preprocedure Evaluation (Signed)
Anesthesia Evaluation  ?Patient identified by MRN, date of birth, ID band ?Patient awake ? ? ? ?Reviewed: ?Allergy & Precautions, H&P , NPO status , Patient's Chart, lab work & pertinent test results, reviewed documented beta blocker date and time  ? ?Airway ?Mallampati: II ? ?TM Distance: >3 FB ?Neck ROM: full ? ? ? Dental ?no notable dental hx. ? ?  ?Pulmonary ?neg pulmonary ROS, Current Smoker and Patient abstained from smoking.,  ?  ?Pulmonary exam normal ?breath sounds clear to auscultation ? ? ? ? ? ? Cardiovascular ?Exercise Tolerance: Good ?negative cardio ROS ? ? ?Rhythm:regular Rate:Normal ? ? ?  ?Neuro/Psych ?negative neurological ROS ? negative psych ROS  ? GI/Hepatic ?negative GI ROS, Neg liver ROS,   ?Endo/Other  ?Hypothyroidism  ? Renal/GU ?negative Renal ROS  ?negative genitourinary ?  ?Musculoskeletal ? ? Abdominal ?  ?Peds ? Hematology ?negative hematology ROS ?(+)   ?Anesthesia Other Findings ? ? Reproductive/Obstetrics ?negative OB ROS ? ?  ? ? ? ? ? ? ? ? ? ? ? ? ? ?  ?  ? ? ? ? ? ? ? ? ?Anesthesia Physical ?Anesthesia Plan ? ?ASA: 2 ? ?Anesthesia Plan: General  ? ?Post-op Pain Management:   ? ?Induction:  ? ?PONV Risk Score and Plan: Propofol infusion ? ?Airway Management Planned:  ? ?Additional Equipment:  ? ?Intra-op Plan:  ? ?Post-operative Plan:  ? ?Informed Consent: I have reviewed the patients History and Physical, chart, labs and discussed the procedure including the risks, benefits and alternatives for the proposed anesthesia with the patient or authorized representative who has indicated his/her understanding and acceptance.  ? ? ? ?Dental Advisory Given ? ?Plan Discussed with: CRNA ? ?Anesthesia Plan Comments:   ? ? ? ? ? ? ?Anesthesia Quick Evaluation ? ?

## 2022-03-23 NOTE — H&P (Signed)
$'@LOGO'v$ @ ? ? ?Primary Care Physician:  Jake Samples, PA-C ?Primary Gastroenterologist:  Dr. Gala Romney ? ?Pre-Procedure History & Physical: ?HPI:  Aaron Nolan is a 61 y.o. male is here for a screening colonoscopy.  Reported negative colonoscopy in the distant past in [place].  No bowel symptoms at this time.  No family history of colon cancer. ? ?Past Medical History:  ?Diagnosis Date  ? Cyst of larynx   ? remote past. Removed  ? Hypothyroidism   ? patient states resolved?   ? Varicose veins   ? ? ?Past Surgical History:  ?Procedure Laterality Date  ? broken ribs    ? collar bone broken    ? COLONOSCOPY    ? [place], no polyps per patient.  ? ENDOVENOUS ABLATION SAPHENOUS VEIN W/ LASER Left 06/01/2016  ? endovenous laser ablation left greater saphenous vein and stab phlebectomy >20 incisions left leg by Curt Jews MD  ? ENDOVENOUS ABLATION SAPHENOUS VEIN W/ LASER Right 07/20/2016  ? endovenous laser ablation right greater saphenous vein and stab phlebectomy 10-20 incisions right leg by Curt Jews MD  ? KNEE SURGERY    ? ? ?Prior to Admission medications   ?Medication Sig Start Date End Date Taking? Authorizing Provider  ?levothyroxine (SYNTHROID) 25 MCG tablet Take 25 mcg by mouth daily before breakfast.   Yes [provider]  ?polyethylene glycol-electrolytes (TRILYTE) 420 g solution Take 4,000 mLs by mouth as directed. 02/09/22  Yes Ferris Fielden, Cristopher Estimable, MD  ?ibuprofen (ADVIL) 200 MG tablet Take 400 mg by mouth every 6 (six) hours as needed for moderate pain or headache.    [provider]  ? ? ?Allergies as of 02/09/2022 - Review Complete 02/02/2022  ?Allergen Reaction Noted  ? Tetanus toxoids  06/11/2019  ? ? ?Family History  ?Problem Relation Age of Onset  ? Colon polyps Mother   ?     35 y.o.  ? Parkinson's disease Father   ? Colon cancer Neg Hx   ? ? ?Social History  ? ?Socioeconomic History  ? Marital status: Married  ?  Spouse name: Not on file  ? Number of children: Not on file  ? Years of  education: Not on file  ? Highest education level: Not on file  ?Occupational History  ? Not on file  ?Tobacco Use  ? Smoking status: Every Day  ?  Packs/day: 0.25  ?  Types: Cigarettes  ? Smokeless tobacco: Never  ?Substance and Sexual Activity  ? Alcohol use: Yes  ?  Alcohol/week: 0.0 standard drinks  ?  Comment: 2 12oz beers/day, average 2.5 on weekend.   ? Drug use: No  ? Sexual activity: Not on file  ?Other Topics Concern  ? Not on file  ?Social History Narrative  ? Not on file  ? ?Social Determinants of Health  ? ?Financial Resource Strain: Not on file  ?Food Insecurity: Not on file  ?Transportation Needs: Not on file  ?Physical Activity: Not on file  ?Stress: Not on file  ?Social Connections: Not on file  ?Intimate Partner Violence: Not on file  ? ? ?Review of Systems: ?See HPI, otherwise negative ROS ? ?Physical Exam: ?BP 102/81   Pulse 70   Temp 98.4 ?F (36.9 ?C) (Oral)   Resp 18   Ht 5' 10.5" (1.791 m)   Wt 95.3 kg   SpO2 96%   BMI 29.71 kg/m?  ?General:   Alert,  Well-developed, well-nourished, pleasant and cooperative in NAD ?Neck:  Supple; no  masses or thyromegaly. ?Lungs:  Clear throughout to auscultation.   No wheezes, crackles, or rhonchi. No acute distress. ?Heart:  Regular rate and rhythm; no murmurs, clicks, rubs,  or gallops. ?Abdomen:  Soft, nontender and nondistended. No masses, hepatosplenomegaly or hernias noted. Normal bowel sounds, without guarding, and without rebound.   ?Impression/Plan: ?Aaron Nolan is now here to undergo a screening colonoscopy.  Average risk screening examination. ? ?Risks, benefits, limitations, imponderables and alternatives regarding colonoscopy have been reviewed with the patient. Questions have been answered. All parties agreeable.  ? ? ? ?Notice:  This dictation was prepared with Dragon dictation along with smaller phrase technology. Any transcriptional errors that result from this process are unintentional and may not be corrected upon review.  ? ?

## 2022-03-23 NOTE — Anesthesia Postprocedure Evaluation (Signed)
Anesthesia Post Note ? ?Patient: Aaron Nolan ? ?Procedure(s) Performed: COLONOSCOPY WITH PROPOFOL ?POLYPECTOMY ? ?Patient location during evaluation: Short Stay ?Anesthesia Type: General ?Level of consciousness: awake and alert ?Pain management: pain level controlled ?Vital Signs Assessment: post-procedure vital signs reviewed and stable ?Respiratory status: spontaneous breathing ?Cardiovascular status: blood pressure returned to baseline and stable ?Postop Assessment: no apparent nausea or vomiting ?Anesthetic complications: no ? ? ?No notable events documented. ? ? ?Last Vitals:  ?Vitals:  ? 03/23/22 0710 03/23/22 0805  ?BP: 102/81 99/66  ?Pulse: 70 78  ?Resp: 18 20  ?Temp: 36.9 ?C 36.6 ?C  ?SpO2: 96% 95%  ?  ?Last Pain:  ?Vitals:  ? 03/23/22 0805  ?TempSrc: Oral  ?PainSc: 0-No pain  ? ? ?  ?  ?  ?  ?  ?  ? ?Trayquan Kolakowski ? ? ? ? ?

## 2022-03-23 NOTE — Transfer of Care (Signed)
Immediate Anesthesia Transfer of Care Note ? ?Patient: Aaron Nolan ? ?Procedure(s) Performed: COLONOSCOPY WITH PROPOFOL ?POLYPECTOMY ? ?Patient Location: Short Stay ? ?Anesthesia Type:General ? ?Level of Consciousness: awake ? ?Airway & Oxygen Therapy: Patient Spontanous Breathing ? ?Post-op Assessment: Report given to RN ? ?Post vital signs: Reviewed and stable ? ?Last Vitals:  ?Vitals Value Taken Time  ?BP    ?Temp    ?Pulse    ?Resp    ?SpO2    ? ? ?Last Pain:  ?Vitals:  ? 03/23/22 0734  ?TempSrc:   ?PainSc: 0-No pain  ?   ? ?Patients Stated Pain Goal: 5 (03/23/22 0710) ? ?Complications: No notable events documented. ?

## 2022-03-23 NOTE — Op Note (Signed)
[facility] ?Patient Name: Aaron Nolan ?Procedure Date: 03/23/2022 7:22 AM ?MRN: [id number] ?Date of Birth: 05-Jan-1961 ?Attending MD: Norvel Richards , MD ?CSN: [id number] ?Age: 61 ?Admit Type: Outpatient ?Procedure:                Colonoscopy ?Indications:              Screening for colorectal malignant neoplasm ?Providers:                Norvel Richards, MD, Caprice Kluver, Eugene Garnet  ?                          Shanon Brow, Technician ?Referring MD:              ?Medicines:                Propofol per Anesthesia ?Complications:            No immediate complications. ?Estimated Blood Loss:     Estimated blood loss was minimal. ?Procedure:                Pre-Anesthesia Assessment: ?                          - Prior to the procedure, a History and Physical  ?                          was performed, and patient medications and  ?                          allergies were reviewed. The patient's tolerance of  ?                          previous anesthesia was also reviewed. The risks  ?                          and benefits of the procedure and the sedation  ?                          options and risks were discussed with the patient.  ?                          All questions were answered, and informed consent  ?                          was obtained. Prior Anticoagulants: The patient has  ?                          taken no previous anticoagulant or antiplatelet  ?                          agents. After reviewing the risks and benefits, the  ?                          patient was deemed in satisfactory condition to  ?  undergo the procedure. ?                          After obtaining informed consent, the colonoscope  ?                          was passed under direct vision. Throughout the  ?                          procedure, the patient's blood pressure, pulse, and  ?                          oxygen saturations were monitored continuously. The  ?                           [id number]) scope was introduced through the  ?                          anus and advanced to the the cecum, identified by  ?                          appendiceal orifice and ileocecal valve. The  ?                          colonoscopy was performed without difficulty. The  ?                          patient tolerated the procedure well. The quality  ?                          of the bowel preparation was adequate. ?Scope In: 7:41:21 AM ?Scope Out: 7:58:45 AM ?Scope Withdrawal Time: 0 hours 12 minutes 48 seconds  ?Total Procedure Duration: 0 hours 17 minutes 24 seconds  ?Findings: ?     The perianal and digital rectal examinations were normal. ?     Internal hemorrhoids were found during retroflexion. The hemorrhoids  ?     were moderate, medium-sized and Grade II (internal hemorrhoids that  ?     prolapse but reduce spontaneously). ?     Four semi-pedunculated polyps were found in the rectum, mid rectum,  ?     splenic flexure and ascending colon. The polyps were 3 to 7 mm in size.  ?     These polyps were removed with a cold snare. Resection and retrieval  ?     were complete. Estimated blood loss was minimal. ?     Scattered small and large-mouthed diverticula were found in the entire  ?     colon. ?     The exam was otherwise without abnormality on direct and retroflexion  ?     views. ?Impression:               - Internal hemorrhoids. ?                          - Four 3 to 7 mm polyps in the rectum, in the mid  ?  rectum, at the splenic flexure and in the ascending  ?                          colon, removed with a cold snare. Resected and  ?                          retrieved. ?                          - Diverticulosis in the entire examined colon. ?                          - The examination was otherwise normal on direct  ?                          and retroflexion views. ?Moderate Sedation: ?     Moderate (conscious) sedation was personally administered by an  ?     anesthesia  professional. The following parameters were monitored: oxygen  ?     saturation, heart rate, blood pressure, respiratory rate, EKG, adequacy  ?     of pulmonary ventilation, and response to care. ?Recommendation:           - Patient has a contact number available for  ?                          emergencies. The signs and symptoms of potential  ?                          delayed complications were discussed with the  ?                          patient. Return to normal activities tomorrow.  ?                          Written discharge instructions were provided to the  ?                          patient. ?                          - Resume previous diet. ?Procedure Code(s):        --- Professional --- ?                          [phone], Colonoscopy, flexible; with removal of  ?                          tumor(s), polyp(s), or other lesion(s) by snare  ?                          technique ?Diagnosis Code(s):        --- Professional --- ?                          Z12.11, Encounter for screening for malignant  ?  neoplasm of colon ?                          K62.1, Rectal polyp ?                          K63.5, Polyp of colon ?                          K64.1, Second degree hemorrhoids ?                          K57.30, Diverticulosis of large intestine without  ?                          perforation or abscess without bleeding ?CPT copyright 2019 [employer]. All rights reserved. ?The codes documented in this report are preliminary and upon coder review may  ?be revised to meet current compliance requirements. ?Cristopher Estimable. Shavy Beachem, MD ?Norvel Richards, MD ?03/23/2022 8:39:06 AM ?This report has been signed electronically. ?Number of Addenda: 0 ?

## 2022-03-23 NOTE — Discharge Instructions (Signed)
?  Colonoscopy ?Discharge Instructions ? ?Read the instructions outlined below and refer to this sheet in the next few weeks. These discharge instructions provide you with general information on caring for yourself after you leave the hospital. Your doctor may also give you specific instructions. While your treatment has been planned according to the most current medical practices available, unavoidable complications occasionally occur. If you have any problems or questions after discharge, call Dr. Gala Romney at [phone]. ?ACTIVITY ?You may resume your regular activity, but move at a slower pace for the next 24 hours.  ?Take frequent rest periods for the next 24 hours.  ?Walking will help get rid of the air and reduce the bloated feeling in your belly (abdomen).  ?No driving for 24 hours (because of the medicine (anesthesia) used during the test).   ?Do not sign any important legal documents or operate any machinery for 24 hours (because of the anesthesia used during the test).  ?NUTRITION ?Drink plenty of fluids.  ?You may resume your normal diet as instructed by your doctor.  ?Begin with a light meal and progress to your normal diet. Heavy or fried foods are harder to digest and may make you feel sick to your stomach (nauseated).  ?Avoid alcoholic beverages for 24 hours or as instructed.  ?MEDICATIONS ?You may resume your normal medications unless your doctor tells you otherwise.  ?WHAT YOU CAN EXPECT TODAY ?Some feelings of bloating in the abdomen.  ?Passage of more gas than usual.  ?Spotting of blood in your stool or on the toilet paper.  ?IF YOU HAD POLYPS REMOVED DURING THE COLONOSCOPY: ?No aspirin products for 7 days or as instructed.  ?No alcohol for 7 days or as instructed.  ?Eat a soft diet for the next 24 hours.  ?FINDING OUT THE RESULTS OF YOUR TEST ?Not all test results are available during your visit. If your test results are not back during the visit, make an appointment with your caregiver to find out the  results. Do not assume everything is normal if you have not heard from your caregiver or the medical facility. It is important for you to follow up on all of your test results.  ?SEEK IMMEDIATE MEDICAL ATTENTION IF: ?You have more than a spotting of blood in your stool.  ?Your belly is swollen (abdominal distention).  ?You are nauseated or vomiting.  ?You have a temperature over 101.  ?You have abdominal pain or discomfort that is severe or gets worse throughout the day.   ? ? ?Multiple polyps removed from your colon today ? ?Colon polyp and diverticulosis information provided ? ?Further recommendations to follow pending review of pathology report ? ?At patient request, I called [facility]low [phone] -rolled to voicemail-left a message. ?

## 2022-03-24 LAB — SURGICAL PATHOLOGY

## 2022-03-25 ENCOUNTER — Encounter: Payer: Self-pay | Admitting: Internal Medicine

## 2022-03-27 ENCOUNTER — Encounter (HOSPITAL_COMMUNITY): Payer: Self-pay | Admitting: Internal Medicine

## 2023-08-10 ENCOUNTER — Other Ambulatory Visit (HOSPITAL_COMMUNITY): Payer: Self-pay | Admitting: Family Medicine

## 2023-08-10 DIAGNOSIS — F17201 Nicotine dependence, unspecified, in remission: Secondary | ICD-10-CM
